# Patient Record
Sex: Female | Born: 1935 | Race: White | Hispanic: No | Smoking: Never smoker
Health system: Southern US, Community
[De-identification: ages and names within clinical notes are randomized; demographics above are authoritative.]

## PROBLEM LIST (undated history)

## (undated) DIAGNOSIS — E039 Hypothyroidism, unspecified: Secondary | ICD-10-CM

## (undated) DIAGNOSIS — I10 Essential (primary) hypertension: Secondary | ICD-10-CM

---

## 1988-10-28 HISTORY — PX: SHOULDER SURGERY: SHX246

## 1990-10-28 HISTORY — PX: SHOULDER SURGERY: SHX246

## 2015-12-26 DIAGNOSIS — E7800 Pure hypercholesterolemia, unspecified: Secondary | ICD-10-CM | POA: Diagnosis not present

## 2015-12-26 DIAGNOSIS — E039 Hypothyroidism, unspecified: Secondary | ICD-10-CM | POA: Diagnosis not present

## 2015-12-26 DIAGNOSIS — I10 Essential (primary) hypertension: Secondary | ICD-10-CM | POA: Diagnosis not present

## 2015-12-26 DIAGNOSIS — Z789 Other specified health status: Secondary | ICD-10-CM | POA: Diagnosis not present

## 2016-06-25 DIAGNOSIS — I10 Essential (primary) hypertension: Secondary | ICD-10-CM | POA: Diagnosis not present

## 2016-09-10 DIAGNOSIS — Z299 Encounter for prophylactic measures, unspecified: Secondary | ICD-10-CM | POA: Diagnosis not present

## 2016-09-10 DIAGNOSIS — Z7189 Other specified counseling: Secondary | ICD-10-CM | POA: Diagnosis not present

## 2016-09-10 DIAGNOSIS — Z1211 Encounter for screening for malignant neoplasm of colon: Secondary | ICD-10-CM | POA: Diagnosis not present

## 2016-09-10 DIAGNOSIS — R5383 Other fatigue: Secondary | ICD-10-CM | POA: Diagnosis not present

## 2016-09-10 DIAGNOSIS — Z79899 Other long term (current) drug therapy: Secondary | ICD-10-CM | POA: Diagnosis not present

## 2016-09-10 DIAGNOSIS — Z1389 Encounter for screening for other disorder: Secondary | ICD-10-CM | POA: Diagnosis not present

## 2016-09-10 DIAGNOSIS — E039 Hypothyroidism, unspecified: Secondary | ICD-10-CM | POA: Diagnosis not present

## 2016-09-10 DIAGNOSIS — Z0000 Encounter for general adult medical examination without abnormal findings: Secondary | ICD-10-CM | POA: Diagnosis not present

## 2016-09-10 DIAGNOSIS — E7800 Pure hypercholesterolemia, unspecified: Secondary | ICD-10-CM | POA: Diagnosis not present

## 2016-09-10 DIAGNOSIS — Z6826 Body mass index (BMI) 26.0-26.9, adult: Secondary | ICD-10-CM | POA: Diagnosis not present

## 2016-09-17 DIAGNOSIS — H35363 Drusen (degenerative) of macula, bilateral: Secondary | ICD-10-CM | POA: Diagnosis not present

## 2016-10-24 DIAGNOSIS — I10 Essential (primary) hypertension: Secondary | ICD-10-CM | POA: Diagnosis not present

## 2016-10-24 DIAGNOSIS — T50905A Adverse effect of unspecified drugs, medicaments and biological substances, initial encounter: Secondary | ICD-10-CM | POA: Diagnosis not present

## 2016-10-24 DIAGNOSIS — J019 Acute sinusitis, unspecified: Secondary | ICD-10-CM | POA: Diagnosis not present

## 2016-10-24 DIAGNOSIS — Z299 Encounter for prophylactic measures, unspecified: Secondary | ICD-10-CM | POA: Diagnosis not present

## 2016-10-24 DIAGNOSIS — T783XXA Angioneurotic edema, initial encounter: Secondary | ICD-10-CM | POA: Diagnosis not present

## 2017-01-14 DIAGNOSIS — I10 Essential (primary) hypertension: Secondary | ICD-10-CM | POA: Diagnosis not present

## 2017-01-14 DIAGNOSIS — Z299 Encounter for prophylactic measures, unspecified: Secondary | ICD-10-CM | POA: Diagnosis not present

## 2017-01-14 DIAGNOSIS — Z6826 Body mass index (BMI) 26.0-26.9, adult: Secondary | ICD-10-CM | POA: Diagnosis not present

## 2017-01-14 DIAGNOSIS — E039 Hypothyroidism, unspecified: Secondary | ICD-10-CM | POA: Diagnosis not present

## 2017-01-14 DIAGNOSIS — Z713 Dietary counseling and surveillance: Secondary | ICD-10-CM | POA: Diagnosis not present

## 2017-04-16 DIAGNOSIS — Z299 Encounter for prophylactic measures, unspecified: Secondary | ICD-10-CM | POA: Diagnosis not present

## 2017-04-16 DIAGNOSIS — E039 Hypothyroidism, unspecified: Secondary | ICD-10-CM | POA: Diagnosis not present

## 2017-04-16 DIAGNOSIS — Z6826 Body mass index (BMI) 26.0-26.9, adult: Secondary | ICD-10-CM | POA: Diagnosis not present

## 2017-04-16 DIAGNOSIS — I10 Essential (primary) hypertension: Secondary | ICD-10-CM | POA: Diagnosis not present

## 2017-04-16 DIAGNOSIS — Z713 Dietary counseling and surveillance: Secondary | ICD-10-CM | POA: Diagnosis not present

## 2017-09-16 DIAGNOSIS — Z79899 Other long term (current) drug therapy: Secondary | ICD-10-CM | POA: Diagnosis not present

## 2017-09-16 DIAGNOSIS — E7800 Pure hypercholesterolemia, unspecified: Secondary | ICD-10-CM | POA: Diagnosis not present

## 2017-09-16 DIAGNOSIS — Z6827 Body mass index (BMI) 27.0-27.9, adult: Secondary | ICD-10-CM | POA: Diagnosis not present

## 2017-09-16 DIAGNOSIS — Z7189 Other specified counseling: Secondary | ICD-10-CM | POA: Diagnosis not present

## 2017-09-16 DIAGNOSIS — E039 Hypothyroidism, unspecified: Secondary | ICD-10-CM | POA: Diagnosis not present

## 2017-09-16 DIAGNOSIS — Z1211 Encounter for screening for malignant neoplasm of colon: Secondary | ICD-10-CM | POA: Diagnosis not present

## 2017-09-16 DIAGNOSIS — Z1339 Encounter for screening examination for other mental health and behavioral disorders: Secondary | ICD-10-CM | POA: Diagnosis not present

## 2017-09-16 DIAGNOSIS — Z1331 Encounter for screening for depression: Secondary | ICD-10-CM | POA: Diagnosis not present

## 2017-09-16 DIAGNOSIS — Z299 Encounter for prophylactic measures, unspecified: Secondary | ICD-10-CM | POA: Diagnosis not present

## 2017-09-16 DIAGNOSIS — R5383 Other fatigue: Secondary | ICD-10-CM | POA: Diagnosis not present

## 2017-09-16 DIAGNOSIS — Z0000 Encounter for general adult medical examination without abnormal findings: Secondary | ICD-10-CM | POA: Diagnosis not present

## 2018-02-17 DIAGNOSIS — Z713 Dietary counseling and surveillance: Secondary | ICD-10-CM | POA: Diagnosis not present

## 2018-02-17 DIAGNOSIS — Z299 Encounter for prophylactic measures, unspecified: Secondary | ICD-10-CM | POA: Diagnosis not present

## 2018-02-17 DIAGNOSIS — E039 Hypothyroidism, unspecified: Secondary | ICD-10-CM | POA: Diagnosis not present

## 2018-02-17 DIAGNOSIS — I10 Essential (primary) hypertension: Secondary | ICD-10-CM | POA: Diagnosis not present

## 2018-07-21 DIAGNOSIS — H43812 Vitreous degeneration, left eye: Secondary | ICD-10-CM | POA: Diagnosis not present

## 2018-09-22 DIAGNOSIS — I10 Essential (primary) hypertension: Secondary | ICD-10-CM | POA: Diagnosis not present

## 2018-09-22 DIAGNOSIS — R5383 Other fatigue: Secondary | ICD-10-CM | POA: Diagnosis not present

## 2018-09-22 DIAGNOSIS — Z79899 Other long term (current) drug therapy: Secondary | ICD-10-CM | POA: Diagnosis not present

## 2018-09-22 DIAGNOSIS — Z6826 Body mass index (BMI) 26.0-26.9, adult: Secondary | ICD-10-CM | POA: Diagnosis not present

## 2018-09-22 DIAGNOSIS — Z0000 Encounter for general adult medical examination without abnormal findings: Secondary | ICD-10-CM | POA: Diagnosis not present

## 2018-09-22 DIAGNOSIS — E7800 Pure hypercholesterolemia, unspecified: Secondary | ICD-10-CM | POA: Diagnosis not present

## 2018-09-22 DIAGNOSIS — Z1331 Encounter for screening for depression: Secondary | ICD-10-CM | POA: Diagnosis not present

## 2018-09-22 DIAGNOSIS — Z1339 Encounter for screening examination for other mental health and behavioral disorders: Secondary | ICD-10-CM | POA: Diagnosis not present

## 2018-09-22 DIAGNOSIS — Z1211 Encounter for screening for malignant neoplasm of colon: Secondary | ICD-10-CM | POA: Diagnosis not present

## 2018-09-22 DIAGNOSIS — Z7189 Other specified counseling: Secondary | ICD-10-CM | POA: Diagnosis not present

## 2018-09-22 DIAGNOSIS — Z299 Encounter for prophylactic measures, unspecified: Secondary | ICD-10-CM | POA: Diagnosis not present

## 2018-09-22 DIAGNOSIS — E039 Hypothyroidism, unspecified: Secondary | ICD-10-CM | POA: Diagnosis not present

## 2018-10-28 DIAGNOSIS — J029 Acute pharyngitis, unspecified: Secondary | ICD-10-CM | POA: Diagnosis not present

## 2018-10-28 DIAGNOSIS — J019 Acute sinusitis, unspecified: Secondary | ICD-10-CM | POA: Diagnosis not present

## 2018-10-28 DIAGNOSIS — R05 Cough: Secondary | ICD-10-CM | POA: Diagnosis not present

## 2018-10-28 DIAGNOSIS — R51 Headache: Secondary | ICD-10-CM | POA: Diagnosis not present

## 2019-01-19 ENCOUNTER — Inpatient Hospital Stay (HOSPITAL_COMMUNITY)
Admission: AD | Admit: 2019-01-19 | Discharge: 2019-01-23 | DRG: 357 | Disposition: A | Discharge status: Home / Self Care | Payer: Medicare Other | Source: Other Acute Inpatient Hospital | Attending: General Surgery | Admitting: General Surgery

## 2019-01-19 ENCOUNTER — Encounter (HOSPITAL_COMMUNITY): Payer: Self-pay | Admitting: *Deleted

## 2019-01-19 ENCOUNTER — Observation Stay (HOSPITAL_COMMUNITY): Payer: Medicare Other

## 2019-01-19 ENCOUNTER — Other Ambulatory Visit: Payer: Self-pay

## 2019-01-19 DIAGNOSIS — K922 Gastrointestinal hemorrhage, unspecified: Secondary | ICD-10-CM | POA: Diagnosis not present

## 2019-01-19 DIAGNOSIS — Z888 Allergy status to other drugs, medicaments and biological substances status: Secondary | ICD-10-CM

## 2019-01-19 DIAGNOSIS — E876 Hypokalemia: Secondary | ICD-10-CM | POA: Diagnosis not present

## 2019-01-19 DIAGNOSIS — Z79899 Other long term (current) drug therapy: Secondary | ICD-10-CM | POA: Diagnosis not present

## 2019-01-19 DIAGNOSIS — K55039 Acute (reversible) ischemia of large intestine, extent unspecified: Secondary | ICD-10-CM | POA: Diagnosis not present

## 2019-01-19 DIAGNOSIS — I10 Essential (primary) hypertension: Secondary | ICD-10-CM | POA: Diagnosis present

## 2019-01-19 DIAGNOSIS — J9811 Atelectasis: Secondary | ICD-10-CM | POA: Diagnosis not present

## 2019-01-19 DIAGNOSIS — Z79890 Hormone replacement therapy (postmenopausal): Secondary | ICD-10-CM | POA: Diagnosis not present

## 2019-01-19 DIAGNOSIS — Z8719 Personal history of other diseases of the digestive system: Secondary | ICD-10-CM | POA: Diagnosis present

## 2019-01-19 DIAGNOSIS — K648 Other hemorrhoids: Secondary | ICD-10-CM | POA: Diagnosis present

## 2019-01-19 DIAGNOSIS — D72829 Elevated white blood cell count, unspecified: Secondary | ICD-10-CM | POA: Diagnosis present

## 2019-01-19 DIAGNOSIS — E039 Hypothyroidism, unspecified: Secondary | ICD-10-CM | POA: Diagnosis present

## 2019-01-19 DIAGNOSIS — K644 Residual hemorrhoidal skin tags: Secondary | ICD-10-CM | POA: Diagnosis present

## 2019-01-19 DIAGNOSIS — K529 Noninfective gastroenteritis and colitis, unspecified: Secondary | ICD-10-CM

## 2019-01-19 DIAGNOSIS — K869 Disease of pancreas, unspecified: Secondary | ICD-10-CM

## 2019-01-19 DIAGNOSIS — K633 Ulcer of intestine: Secondary | ICD-10-CM | POA: Diagnosis present

## 2019-01-19 DIAGNOSIS — K625 Hemorrhage of anus and rectum: Secondary | ICD-10-CM

## 2019-01-19 HISTORY — DX: Hypothyroidism, unspecified: E03.9

## 2019-01-19 HISTORY — DX: Essential (primary) hypertension: I10

## 2019-01-19 LAB — CBC
HCT: 44.4 % (ref 36.0–46.0)
Hemoglobin: 15.2 g/dL — ABNORMAL HIGH (ref 12.0–15.0)
MCH: 30.6 pg (ref 26.0–34.0)
MCHC: 34.2 g/dL (ref 30.0–36.0)
MCV: 89.5 fL (ref 80.0–100.0)
Platelets: 196 10*3/uL (ref 150–400)
RBC: 4.96 MIL/uL (ref 3.87–5.11)
RDW: 13.6 % (ref 11.5–15.5)
WBC: 16.8 10*3/uL — ABNORMAL HIGH (ref 4.0–10.5)
nRBC: 0 % (ref 0.0–0.2)

## 2019-01-19 LAB — BASIC METABOLIC PANEL
Anion gap: 12 (ref 5–15)
BUN: 17 mg/dL (ref 8–23)
CHLORIDE: 101 mmol/L (ref 98–111)
CO2: 24 mmol/L (ref 22–32)
Calcium: 9.5 mg/dL (ref 8.9–10.3)
Creatinine, Ser: 0.68 mg/dL (ref 0.44–1.00)
GFR calc Af Amer: >60 mL/min (ref >60)
GFR calc non Af Amer: >60 mL/min (ref >60)
Glucose, Bld: 153 mg/dL — ABNORMAL HIGH (ref 70–99)
Potassium: 4.6 mmol/L (ref 3.5–5.1)
Sodium: 137 mmol/L (ref 135–145)

## 2019-01-19 LAB — TYPE AND SCREEN
ABO/RH(D): A POS
Antibody Screen: NEGATIVE

## 2019-01-19 MED ORDER — ACETAMINOPHEN 325 MG PO TABS: 650.0000 mg | ORAL_TABLET | Freq: Four times a day (QID) | ORAL | Status: DC | PRN

## 2019-01-19 MED ORDER — PEG 3350-KCL-NA BICARB-NACL 420 G PO SOLR
2000.0000 mL | Freq: Once | ORAL | Status: AC
  Administered 2019-01-20: 2000 mL via ORAL

## 2019-01-19 MED ORDER — SODIUM CHLORIDE 0.9 % IV SOLN: 250.0000 mL | INTRAVENOUS | Status: DC | PRN

## 2019-01-19 MED ORDER — SODIUM CHLORIDE 0.9% FLUSH: 3.0000 mL | INTRAVENOUS | Status: DC | PRN

## 2019-01-19 MED ORDER — TRAZODONE HCL 50 MG PO TABS: 25.0000 mg | ORAL_TABLET | Freq: Every evening | ORAL | Status: DC | PRN

## 2019-01-19 MED ORDER — LEVOTHYROXINE SODIUM 75 MCG PO TABS
75.0000 ug | ORAL_TABLET | Freq: Every day | ORAL | Status: DC
  Administered 2019-01-20 – 2019-01-23 (×4): 75 ug via ORAL
  Filled 2019-01-19 (×4): qty 1

## 2019-01-19 MED ORDER — ONDANSETRON HCL 4 MG/2ML IJ SOLN
4.0000 mg | Freq: Four times a day (QID) | INTRAMUSCULAR | Status: DC | PRN
  Administered 2019-01-19: 4 mg via INTRAVENOUS
  Filled 2019-01-19 (×2): qty 2

## 2019-01-19 MED ORDER — METOPROLOL SUCCINATE ER 50 MG PO TB24
75.0000 mg | ORAL_TABLET | Freq: Two times a day (BID) | ORAL | Status: DC
  Administered 2019-01-19 – 2019-01-23 (×9): 75 mg via ORAL
  Filled 2019-01-19 (×9): qty 1

## 2019-01-19 MED ORDER — ONDANSETRON HCL 4 MG PO TABS
4.0000 mg | ORAL_TABLET | Freq: Four times a day (QID) | ORAL | Status: DC | PRN
  Administered 2019-01-19: 4 mg via ORAL
  Filled 2019-01-19: qty 1

## 2019-01-19 MED ORDER — SODIUM CHLORIDE 0.9% FLUSH
3.0000 mL | Freq: Two times a day (BID) | INTRAVENOUS | Status: DC
  Administered 2019-01-19 – 2019-01-23 (×5): 3 mL via INTRAVENOUS

## 2019-01-19 MED ORDER — PANTOPRAZOLE SODIUM 40 MG PO TBEC
40.0000 mg | DELAYED_RELEASE_TABLET | Freq: Every day | ORAL | Status: DC
  Administered 2019-01-19 – 2019-01-23 (×5): 40 mg via ORAL
  Filled 2019-01-19 (×5): qty 1

## 2019-01-19 MED ORDER — ACETAMINOPHEN 650 MG RE SUPP: 650.0000 mg | Freq: Four times a day (QID) | RECTAL | Status: DC | PRN

## 2019-01-19 MED ORDER — PEG 3350-KCL-NA BICARB-NACL 420 G PO SOLR
2000.0000 mL | Freq: Once | ORAL | Status: AC
  Administered 2019-01-19: 2000 mL via ORAL

## 2019-01-19 NOTE — H&P (View-Only) (Signed)
Referring Provider: Dr. Laural Benes Primary Care Physician:  Ignatius Specking, MD Primary Gastroenterologist:  Dr. Darrick Penna   Date of Admission: 01/19/2019 Date of Consultation: 01/19/2019  Reason for Consultation:  Rectal bleeding  HPI:  Susan Mercado is an 83 y.o. year old female who presented to [facility] ED early this morning with new onset rectal bleeding. She was transferred here as no GI coverage available in [place]. Per H&P, Hgb was 14 at outside facility. Repeat Hgb 15.2 today.    Woke up and had urge to have a BM around 3 am and subsequently had large amount of bright red blood per rectum. She had several more episodes but notes bleeding has been tapering.  Dark pieces of blood just recently "50% less than before". Water is lighter in color. No abdominal pain. Stomach feels "blah". No prior history of GI bleeding. No prior colonoscopy. No issues with constipation or straining. No rectal discomfort, itching, burning. No NSAIDs. No family history of colorectal cancer or polyps. BM usually daily. Sometimes hard, sometimes soft. Comes out in small pieces at a time. No bowel habit changes.   Lives at home alone. Active. Exercises.   Past Medical History:  Diagnosis Date  . Hypertension   . Hypothyroidism     Past Surgical History:  Procedure Laterality Date  . SHOULDER SURGERY  1990   bil shoulder spurs removed  . SHOULDER SURGERY  1992   spurs    Prior to Admission medications   Medication Sig Start Date End Date Taking? Authorizing Provider  amLODipine (NORVASC) 5 MG tablet Take 5 mg by mouth daily. 10/25/18  Yes [provider]  levothyroxine (SYNTHROID, LEVOTHROID) 75 MCG tablet Take 75 mcg by mouth daily. 11/02/18  Yes [provider]  metoprolol succinate (TOPROL-XL) 50 MG 24 hr tablet Take 1.5 tablets by mouth 2 (two) times daily. 11/02/18  Yes [provider]    Current Facility-Administered Medications  Medication Dose Route Frequency Provider  Last Rate Last Dose  . 0.9 %  sodium chloride infusion  250 mL Intravenous PRN Johnson, Clanford L, MD      . acetaminophen (TYLENOL) tablet 650 mg  650 mg Oral Q6H PRN Johnson, Clanford L, MD       Or  . acetaminophen (TYLENOL) suppository 650 mg  650 mg Rectal Q6H PRN Laural Benes, Clanford L, MD      . Melene Muller ON 01/20/2019] levothyroxine (SYNTHROID, LEVOTHROID) tablet 75 mcg  75 mcg Oral QAC breakfast Johnson, Clanford L, MD      . metoprolol succinate (TOPROL-XL) 24 hr tablet 75 mg  75 mg Oral BID Johnson, Clanford L, MD   75 mg at 01/19/19 1132  . ondansetron (ZOFRAN) tablet 4 mg  4 mg Oral Q6H PRN Johnson, Clanford L, MD   4 mg at 01/19/19 1137   Or  . ondansetron (ZOFRAN) injection 4 mg  4 mg Intravenous Q6H PRN Johnson, Clanford L, MD      . pantoprazole (PROTONIX) EC tablet 40 mg  40 mg Oral Daily Johnson, Clanford L, MD   40 mg at 01/19/19 1133  . sodium chloride flush (NS) 0.9 % injection 3 mL  3 mL Intravenous Q12H Johnson, Clanford L, MD      . sodium chloride flush (NS) 0.9 % injection 3 mL  3 mL Intravenous PRN Johnson, Clanford L, MD      . traZODone (DESYREL) tablet 25 mg  25 mg Oral QHS PRN Cleora Fleet, MD  Allergies as of 01/19/2019 - Review Complete 01/19/2019  Allergen Reaction Noted  . Lisinopril Swelling 01/19/2019  . Shellfish allergy Nausea And Vomiting 01/19/2019    Family History  Problem Relation Age of Onset  . Colon cancer Neg Hx   . Colon polyps Neg Hx     Social History   Socioeconomic History  . Marital status: Divorced    Spouse name: Not on file  . Number of children: Not on file  . Years of education: Not on file  . Highest education level: Not on file  Occupational History  . Occupation: retired    Comment: clerical in a factory, Engineer, materials at [employer]  . Financial resource strain: Not hard at all  . Food insecurity:    Worry: Never true    Inability: Never true  . Transportation needs:    Medical: No    Non-medical: No   Tobacco Use  . Smoking status: Never Smoker  . Smokeless tobacco: Never Used  Substance and Sexual Activity  . Alcohol use: Not Currently  . Drug use: Not Currently  . Sexual activity: Not Currently  Lifestyle  . Physical activity:    Days per week: 7 days    Minutes per session: 20 min  . Stress: Not at all  Relationships  . Social connections:    Talks on phone: Patient refused    Gets together: Patient refused    Attends religious service: Patient refused    Active member of club or organization: Patient refused    Attends meetings of clubs or organizations: Patient refused    Relationship status: Patient refused  . Intimate partner violence:    Fear of current or ex partner: Patient refused    Emotionally abused: Patient refused    Physically abused: Patient refused    Forced sexual activity: Patient refused  Other Topics Concern  . Not on file  Social History Narrative  . Not on file    Review of Systems: Gen: Denies fever, chills, loss of appetite, change in weight or weight loss CV: Denies chest pain, heart palpitations, syncope, edema  Resp: Denies shortness of breath with rest, cough, wheezing GI: see HPI GU : Denies urinary burning, urinary frequency, urinary incontinence.  MS: Denies joint pain,swelling, cramping Derm: Denies rash, itching, dry skin Psych: Denies depression, anxiety,confusion, or memory loss Heme: see HPI  Physical Exam: Vital signs in last 24 hours: Temp:  [97.7 F (36.5 C)] 97.7 F (36.5 C) (03/24 0952) Pulse Rate:  [87] 87 (03/24 0952) Resp:  [18] 18 (03/24 0952) BP: (140)/(70) 140/70 (03/24 0952) SpO2:  [98 %] 98 % (03/24 0952) Weight:  [69.1 kg] 69.1 kg (03/24 0952) Last BM Date: 01/19/19 General:   Alert,  Well-developed, well-nourished, pleasant and cooperative in NAD Head:  Normocephalic and atraumatic. Eyes:  Sclera clear, no icterus.   Conjunctiva pink. Ears:  Normal auditory acuity. Nose:  No deformity, discharge,  or  lesions. Mouth:  No deformity or lesions, dentition normal. Lungs:  Clear throughout to auscultation.   No wheezes, crackles, or rhonchi. No acute distress. Heart:  Regular rate and rhythm; no murmurs, clicks, rubs,  or gallops. Abdomen:  Soft, nontender and nondistended. No masses, hepatosplenomegaly or hernias noted. Normal bowel sounds, without guarding, and without rebound.   Rectal:  Deferred until time of colonoscopy.   Msk:  Symmetrical without gross deformities. Normal posture. Extremities:  Without  edema. Neurologic:  Alert and  oriented x4 Psych:  Alert and  cooperative. Normal mood and affect.  Intake/Output from previous day: No intake/output data recorded. Intake/Output this shift: No intake/output data recorded.  Lab Results: Recent Labs    01/19/19 1118  WBC 16.8*  HGB 15.2*  HCT 44.4  PLT 196   BMET Recent Labs    01/19/19 1118  NA 137  K 4.6  CL 101  CO2 24  GLUCOSE 153*  BUN 17  CREATININE 0.68  CALCIUM 9.5      Impression/Plan: 82-year-old female with new onset painless hematochezia, hemodynamically stable, without acute blood loss anemia noted. Clinically improving since onset of rectal bleeding with reported tapering of bleeding. No prior colonoscopy, and she denies any NSAIDs. No family history of colorectal cancer or polyps. Differentials including benign anorectal source, self-limiting diverticular bleed, less likely occult malignancy or polyps. Does not seem consistent with ischemic colitis. She desires endoscopic evaluation while inpatient.   Remain on clear liquids and will discuss with Dr. Fields diagnostic colonoscopy on 3/25. Risks and benefits discussed with patient in detail, and she desires to proceed. NPO after midnight except sips with meds. Further recommendations to follow.  Dezaree Tracey W. Chelsee Hosie, PhD, ANP-BC [facility]       LOS: 1 day    01/19/2019, 1:24 PM    

## 2019-01-19 NOTE — H&P (Signed)
History and Physical  Breland Kilcrease HUT:[id number] DOB: [birth year] DOA: 01/19/2019  Referring physician: Alona Bene MD  PCP: Ignatius Specking, MD   Chief Complaint: rectal bleeding  HPI: Susan Mercado is a 83 y.o. female in fairly good health with history of hypothyroidism and hypertension and not taking anticoagulants.  She lives alone but has family that lives close and checks on her regularly.  She reports that she was feeling well yesterday and had nothing to eat or drink out of the ordinary.  She says that she woke up in the middle of the night with an overwhelming urge to defecate.  She noticed a large amount of blood in the toilet that she describes as reddish to pink tinged water. She has had several small bloody stools since that time.  She denies having any pain with defecation.  She denies abdominal pain.  She denies diarrhea and fever.  She denies constipation or straining.  She denies cough.  She denies any recent sick contacts.  She has no prior history of GI bleeding.  She only follows her PCP.  She has never seen GI and does not recall any prior GI screening with EGD or colonoscopy.  She denies taking NSAIDS and aspirin.  She was seen at [facility] ER.  She has been hemodynamically stable.  Her Hg was 14.  She was hemoccult positive.  Her WBC was 16.3, Platelets 161, glucose 187, Creatinine 0.68, sodium 136, Anion Gap 26, CO2 19, lipase 24, AST 22, ALT 10, potassium 4.4.      The ED provider spoke with Dr. Sanda Klein who accepted patient for transfer to [facility] so that she can have GI consultation.    Review of Systems: All systems reviewed and apart from history of presenting illness, are negative.  Past Medical History:  Diagnosis Date  . Hypertension   . Hypothyroidism    Past Surgical History:  Procedure Laterality Date  . SHOULDER SURGERY  1990   bil shoulder spurs removed   Social History:  reports that she has never smoked. She has never used smokeless tobacco. She  reports previous alcohol use. She reports previous drug use.  Allergies  Allergen Reactions  . Lisinopril Swelling  . Shellfish Allergy Nausea And Vomiting    History reviewed. No pertinent family history.  Prior to Admission medications   Not on File   Physical Exam: Vitals:   01/19/19 0952  BP: 140/70  Pulse: 87  Resp: 18  Temp: 97.7 F (36.5 C)  TempSrc: Oral  SpO2: 98%  Weight: 69.1 kg  Height: 5' 5.5" (1.664 m)     General exam: Moderately built and nourished patient, lying comfortably supine on the gurney in no obvious distress.  Head, eyes and ENT: Nontraumatic and normocephalic. Pupils equally reacting to light and accommodation. Oral mucosa dry.  Neck: Supple. No JVD, carotid bruit or thyromegaly.  Lymphatics: No lymphadenopathy.  Respiratory system: Clear to auscultation. No increased work of breathing.  Cardiovascular system: S1 and S2 heard, RRR. No JVD, murmurs, gallops, clicks or pedal edema.  Gastrointestinal system: Abdomen is nondistended, soft and nontender. Normal bowel sounds heard. No organomegaly or masses appreciated.  Central nervous system: Alert and oriented. No focal neurological deficits.  Extremities: Symmetric 5 x 5 power. Peripheral pulses symmetrically felt.   Skin: No rashes or acute findings.  Musculoskeletal system: Negative exam.  Psychiatry: Pleasant and cooperative.  Labs on Admission:  Basic Metabolic Panel: No results for input(s): NA, K, CL, CO2, GLUCOSE,  BUN, CREATININE, CALCIUM, MG, PHOS in the last 168 hours. Liver Function Tests: No results for input(s): AST, ALT, ALKPHOS, BILITOT, PROT, ALBUMIN in the last 168 hours. No results for input(s): LIPASE, AMYLASE in the last 168 hours. No results for input(s): AMMONIA in the last 168 hours. CBC: No results for input(s): WBC, NEUTROABS, HGB, HCT, MCV, PLT in the last 168 hours. Cardiac Enzymes: No results for input(s): CKTOTAL, CKMB, CKMBINDEX, TROPONINI in the last  168 hours.  BNP (last 3 results) No results for input(s): PROBNP in the last 8760 hours. CBG: No results for input(s): GLUCAP in the last 168 hours.  Radiological Exams on Admission: No results found.  Assessment/Plan Principal Problem:   Painless rectal bleeding Active Problems:   Hypertension   Hypothyroidism  1. Painless rectal bleeding - differential includes diverticulosis, hemorrhoids, bleeding polyps and possible malignancy given patient has never had a colon cancer screening.  Pt is hemodynamically stable and bleeding seems to be slowing down.  I have requested a GI consultation.  Follow Hg.  Further management pending GI recommendations.  Pt may qualify for outpatient work up.   2. Hypertension - resume home metoprolol. BPs well controlled. Hold home amlodipine for now.   3. Hypothyrodism - resume home levothyroxine.    DVT Prophylaxis: SCDs Code Status: Full   Family Communication: patient   Disposition Plan: Observation    Time spent: 56 minutes   Jaaron Oleson Laural Benes, MD [facility] How to contact the [facility] Attending or Consulting provider 7A - 7P or covering provider during after hours 7P -7A, for this patient?  1. Check the care team in [facility] and look for a) attending/consulting [facility] provider listed and b) the [facility] team listed 2. Log into [email or web address] and use [facility] universal password to access. If you do not have the password, please contact the hospital operator. 3. Locate the [facility] provider you are looking for under [facility] and page to a number that you can be directly reached. 4. If you still have difficulty reaching the provider, please page the [facility] (Director on Call) for the Hospitalists listed on amion for assistance.

## 2019-01-19 NOTE — Consult Note (Signed)
Referring Provider: Dr. Laural Benes Primary Care Physician:  Ignatius Specking, MD Primary Gastroenterologist:  Dr. Darrick Penna   Date of Admission: 01/19/2019 Date of Consultation: 01/19/2019  Reason for Consultation:  Rectal bleeding  HPI:  Susan Mercado is an 83 y.o. year old female who presented to [facility] ED early this morning with new onset rectal bleeding. She was transferred here as no GI coverage available in [place]. Per H&P, Hgb was 14 at outside facility. Repeat Hgb 15.2 today.    Woke up and had urge to have a BM around 3 am and subsequently had large amount of bright red blood per rectum. She had several more episodes but notes bleeding has been tapering.  Dark pieces of blood just recently "50% less than before". Water is lighter in color. No abdominal pain. Stomach feels "blah". No prior history of GI bleeding. No prior colonoscopy. No issues with constipation or straining. No rectal discomfort, itching, burning. No NSAIDs. No family history of colorectal cancer or polyps. BM usually daily. Sometimes hard, sometimes soft. Comes out in small pieces at a time. No bowel habit changes.   Lives at home alone. Active. Exercises.   Past Medical History:  Diagnosis Date  . Hypertension   . Hypothyroidism     Past Surgical History:  Procedure Laterality Date  . SHOULDER SURGERY  1990   bil shoulder spurs removed  . SHOULDER SURGERY  1992   spurs    Prior to Admission medications   Medication Sig Start Date End Date Taking? Authorizing Provider  amLODipine (NORVASC) 5 MG tablet Take 5 mg by mouth daily. 10/25/18  Yes [provider]  levothyroxine (SYNTHROID, LEVOTHROID) 75 MCG tablet Take 75 mcg by mouth daily. 11/02/18  Yes [provider]  metoprolol succinate (TOPROL-XL) 50 MG 24 hr tablet Take 1.5 tablets by mouth 2 (two) times daily. 11/02/18  Yes [provider]    Current Facility-Administered Medications  Medication Dose Route Frequency Provider  Last Rate Last Dose  . 0.9 %  sodium chloride infusion  250 mL Intravenous PRN Johnson, Clanford L, MD      . acetaminophen (TYLENOL) tablet 650 mg  650 mg Oral Q6H PRN Johnson, Clanford L, MD       Or  . acetaminophen (TYLENOL) suppository 650 mg  650 mg Rectal Q6H PRN Laural Benes, Clanford L, MD      . Melene Muller ON 01/20/2019] levothyroxine (SYNTHROID, LEVOTHROID) tablet 75 mcg  75 mcg Oral QAC breakfast Johnson, Clanford L, MD      . metoprolol succinate (TOPROL-XL) 24 hr tablet 75 mg  75 mg Oral BID Johnson, Clanford L, MD   75 mg at 01/19/19 1132  . ondansetron (ZOFRAN) tablet 4 mg  4 mg Oral Q6H PRN Johnson, Clanford L, MD   4 mg at 01/19/19 1137   Or  . ondansetron (ZOFRAN) injection 4 mg  4 mg Intravenous Q6H PRN Johnson, Clanford L, MD      . pantoprazole (PROTONIX) EC tablet 40 mg  40 mg Oral Daily Johnson, Clanford L, MD   40 mg at 01/19/19 1133  . sodium chloride flush (NS) 0.9 % injection 3 mL  3 mL Intravenous Q12H Johnson, Clanford L, MD      . sodium chloride flush (NS) 0.9 % injection 3 mL  3 mL Intravenous PRN Johnson, Clanford L, MD      . traZODone (DESYREL) tablet 25 mg  25 mg Oral QHS PRN Cleora Fleet, MD  Allergies as of 01/19/2019 - Review Complete 01/19/2019  Allergen Reaction Noted  . Lisinopril Swelling 01/19/2019  . Shellfish allergy Nausea And Vomiting 01/19/2019    Family History  Problem Relation Age of Onset  . Colon cancer Neg Hx   . Colon polyps Neg Hx     Social History   Socioeconomic History  . Marital status: Divorced    Spouse name: Not on file  . Number of children: Not on file  . Years of education: Not on file  . Highest education level: Not on file  Occupational History  . Occupation: retired    Comment: clerical in a factory, Engineer, materials at [employer]  . Financial resource strain: Not hard at all  . Food insecurity:    Worry: Never true    Inability: Never true  . Transportation needs:    Medical: No    Non-medical: No   Tobacco Use  . Smoking status: Never Smoker  . Smokeless tobacco: Never Used  Substance and Sexual Activity  . Alcohol use: Not Currently  . Drug use: Not Currently  . Sexual activity: Not Currently  Lifestyle  . Physical activity:    Days per week: 7 days    Minutes per session: 20 min  . Stress: Not at all  Relationships  . Social connections:    Talks on phone: Patient refused    Gets together: Patient refused    Attends religious service: Patient refused    Active member of club or organization: Patient refused    Attends meetings of clubs or organizations: Patient refused    Relationship status: Patient refused  . Intimate partner violence:    Fear of current or ex partner: Patient refused    Emotionally abused: Patient refused    Physically abused: Patient refused    Forced sexual activity: Patient refused  Other Topics Concern  . Not on file  Social History Narrative  . Not on file    Review of Systems: Gen: Denies fever, chills, loss of appetite, change in weight or weight loss CV: Denies chest pain, heart palpitations, syncope, edema  Resp: Denies shortness of breath with rest, cough, wheezing GI: see HPI GU : Denies urinary burning, urinary frequency, urinary incontinence.  MS: Denies joint pain,swelling, cramping Derm: Denies rash, itching, dry skin Psych: Denies depression, anxiety,confusion, or memory loss Heme: see HPI  Physical Exam: Vital signs in last 24 hours: Temp:  [97.7 F (36.5 C)] 97.7 F (36.5 C) (03/24 0952) Pulse Rate:  [87] 87 (03/24 0952) Resp:  [18] 18 (03/24 0952) BP: (140)/(70) 140/70 (03/24 0952) SpO2:  [98 %] 98 % (03/24 0952) Weight:  [69.1 kg] 69.1 kg (03/24 0952) Last BM Date: 01/19/19 General:   Alert,  Well-developed, well-nourished, pleasant and cooperative in NAD Head:  Normocephalic and atraumatic. Eyes:  Sclera clear, no icterus.   Conjunctiva pink. Ears:  Normal auditory acuity. Nose:  No deformity, discharge,  or  lesions. Mouth:  No deformity or lesions, dentition normal. Lungs:  Clear throughout to auscultation.   No wheezes, crackles, or rhonchi. No acute distress. Heart:  Regular rate and rhythm; no murmurs, clicks, rubs,  or gallops. Abdomen:  Soft, nontender and nondistended. No masses, hepatosplenomegaly or hernias noted. Normal bowel sounds, without guarding, and without rebound.   Rectal:  Deferred until time of colonoscopy.   Msk:  Symmetrical without gross deformities. Normal posture. Extremities:  Without  edema. Neurologic:  Alert and  oriented x4 Psych:  Alert and  cooperative. Normal mood and affect.  Intake/Output from previous day: No intake/output data recorded. Intake/Output this shift: No intake/output data recorded.  Lab Results: Recent Labs    01/19/19 1118  WBC 16.8*  HGB 15.2*  HCT 44.4  PLT 196   BMET Recent Labs    01/19/19 1118  NA 137  K 4.6  CL 101  CO2 24  GLUCOSE 153*  BUN 17  CREATININE 0.68  CALCIUM 9.5      Impression/Plan: 83 year old female with new onset painless hematochezia, hemodynamically stable, without acute blood loss anemia noted. Clinically improving since onset of rectal bleeding with reported tapering of bleeding. No prior colonoscopy, and she denies any NSAIDs. No family history of colorectal cancer or polyps. Differentials including benign anorectal source, self-limiting diverticular bleed, less likely occult malignancy or polyps. Does not seem consistent with ischemic colitis. She desires endoscopic evaluation while inpatient.   Remain on clear liquids and will discuss with Dr. Darrick Penna diagnostic colonoscopy on 3/25. Risks and benefits discussed with patient in detail, and she desires to proceed. NPO after midnight except sips with meds. Further recommendations to follow.  Susan Mink, PhD, ANP-BC [facility]       LOS: 1 day    01/19/2019, 1:24 PM

## 2019-01-20 ENCOUNTER — Observation Stay (HOSPITAL_COMMUNITY): Payer: Medicare Other | Admitting: Anesthesiology

## 2019-01-20 ENCOUNTER — Encounter (HOSPITAL_COMMUNITY)
Admission: AD | Disposition: A | Discharge status: Home / Self Care | Payer: Self-pay | Source: Other Acute Inpatient Hospital | Attending: General Surgery

## 2019-01-20 ENCOUNTER — Encounter (HOSPITAL_COMMUNITY): Payer: Self-pay | Admitting: Gastroenterology

## 2019-01-20 ENCOUNTER — Observation Stay (HOSPITAL_COMMUNITY): Payer: Medicare Other

## 2019-01-20 DIAGNOSIS — K55049 Acute infarction of large intestine, extent unspecified: Secondary | ICD-10-CM | POA: Diagnosis not present

## 2019-01-20 DIAGNOSIS — K633 Ulcer of intestine: Secondary | ICD-10-CM

## 2019-01-20 DIAGNOSIS — K529 Noninfective gastroenteritis and colitis, unspecified: Secondary | ICD-10-CM | POA: Diagnosis not present

## 2019-01-20 DIAGNOSIS — Z79890 Hormone replacement therapy (postmenopausal): Secondary | ICD-10-CM | POA: Diagnosis not present

## 2019-01-20 DIAGNOSIS — D72829 Elevated white blood cell count, unspecified: Secondary | ICD-10-CM

## 2019-01-20 DIAGNOSIS — E039 Hypothyroidism, unspecified: Secondary | ICD-10-CM | POA: Diagnosis present

## 2019-01-20 DIAGNOSIS — K625 Hemorrhage of anus and rectum: Secondary | ICD-10-CM | POA: Diagnosis not present

## 2019-01-20 DIAGNOSIS — Z888 Allergy status to other drugs, medicaments and biological substances status: Secondary | ICD-10-CM | POA: Diagnosis not present

## 2019-01-20 DIAGNOSIS — I361 Nonrheumatic tricuspid (valve) insufficiency: Secondary | ICD-10-CM | POA: Diagnosis not present

## 2019-01-20 DIAGNOSIS — K648 Other hemorrhoids: Secondary | ICD-10-CM

## 2019-01-20 DIAGNOSIS — K55039 Acute (reversible) ischemia of large intestine, extent unspecified: Secondary | ICD-10-CM

## 2019-01-20 DIAGNOSIS — J9811 Atelectasis: Secondary | ICD-10-CM | POA: Diagnosis present

## 2019-01-20 DIAGNOSIS — K869 Disease of pancreas, unspecified: Secondary | ICD-10-CM | POA: Diagnosis not present

## 2019-01-20 DIAGNOSIS — Z79899 Other long term (current) drug therapy: Secondary | ICD-10-CM | POA: Diagnosis not present

## 2019-01-20 DIAGNOSIS — R188 Other ascites: Secondary | ICD-10-CM | POA: Diagnosis not present

## 2019-01-20 DIAGNOSIS — Z8719 Personal history of other diseases of the digestive system: Secondary | ICD-10-CM | POA: Diagnosis present

## 2019-01-20 DIAGNOSIS — K644 Residual hemorrhoidal skin tags: Secondary | ICD-10-CM | POA: Diagnosis present

## 2019-01-20 DIAGNOSIS — I10 Essential (primary) hypertension: Secondary | ICD-10-CM | POA: Diagnosis present

## 2019-01-20 DIAGNOSIS — E876 Hypokalemia: Secondary | ICD-10-CM | POA: Diagnosis not present

## 2019-01-20 HISTORY — PX: BIOPSY: SHX5522

## 2019-01-20 HISTORY — PX: SIGMOIDOSCOPY: SHX6686

## 2019-01-20 HISTORY — PX: COLECTOMY WITH COLOSTOMY CREATION/HARTMANN PROCEDURE: SHX6598

## 2019-01-20 LAB — CBC
HCT: 43.2 % (ref 36.0–46.0)
Hemoglobin: 14.5 g/dL (ref 12.0–15.0)
MCH: 30.2 pg (ref 26.0–34.0)
MCHC: 33.6 g/dL (ref 30.0–36.0)
MCV: 90 fL (ref 80.0–100.0)
NRBC: 0 % (ref 0.0–0.2)
Platelets: 195 10*3/uL (ref 150–400)
RBC: 4.8 MIL/uL (ref 3.87–5.11)
RDW: 13.8 % (ref 11.5–15.5)
WBC: 21.7 10*3/uL — ABNORMAL HIGH (ref 4.0–10.5)

## 2019-01-20 LAB — BASIC METABOLIC PANEL
ANION GAP: 7 (ref 5–15)
BUN: 13 mg/dL (ref 8–23)
CO2: 23 mmol/L (ref 22–32)
Calcium: 8.8 mg/dL — ABNORMAL LOW (ref 8.9–10.3)
Chloride: 105 mmol/L (ref 98–111)
Creatinine, Ser: 0.75 mg/dL (ref 0.44–1.00)
GFR calc Af Amer: >60 mL/min (ref >60)
GFR calc non Af Amer: >60 mL/min (ref >60)
Glucose, Bld: 151 mg/dL — ABNORMAL HIGH (ref 70–99)
POTASSIUM: 3.9 mmol/L (ref 3.5–5.1)
Sodium: 135 mmol/L (ref 135–145)

## 2019-01-20 SURGERY — SIGMOIDOSCOPY

## 2019-01-20 SURGERY — COLECTOMY, WITH COLOSTOMY CREATION
Anesthesia: General

## 2019-01-20 MED ORDER — BUPIVACAINE LIPOSOME 1.3 % IJ SUSP
INTRAMUSCULAR | Status: DC | PRN
  Administered 2019-01-20: 20 mL

## 2019-01-20 MED ORDER — HYDROCODONE-ACETAMINOPHEN 5-325 MG PO TABS
1.0000 | ORAL_TABLET | ORAL | Status: DC | PRN
  Administered 2019-01-20 – 2019-01-21 (×3): 1 via ORAL
  Filled 2019-01-20 (×3): qty 1

## 2019-01-20 MED ORDER — ARTIFICIAL TEARS OPHTHALMIC OINT
TOPICAL_OINTMENT | OPHTHALMIC | Status: AC
  Filled 2019-01-20: qty 7

## 2019-01-20 MED ORDER — PROPOFOL 10 MG/ML IV BOLUS
INTRAVENOUS | Status: AC
  Filled 2019-01-20: qty 20

## 2019-01-20 MED ORDER — PIPERACILLIN-TAZOBACTAM 3.375 G IVPB 30 MIN
INTRAVENOUS | Status: AC | PRN
  Administered 2019-01-20: 3.375 g via INTRAVENOUS

## 2019-01-20 MED ORDER — POVIDONE-IODINE 10 % OINT PACKET
TOPICAL_OINTMENT | CUTANEOUS | Status: DC | PRN
  Administered 2019-01-20: 1 via TOPICAL

## 2019-01-20 MED ORDER — ENOXAPARIN SODIUM 40 MG/0.4ML ~~LOC~~ SOLN
40.0000 mg | SUBCUTANEOUS | Status: DC
  Administered 2019-01-21 – 2019-01-23 (×3): 40 mg via SUBCUTANEOUS
  Filled 2019-01-20 (×3): qty 0.4

## 2019-01-20 MED ORDER — KETOROLAC TROMETHAMINE 15 MG/ML IJ SOLN
INTRAMUSCULAR | Status: AC
  Filled 2019-01-20: qty 1

## 2019-01-20 MED ORDER — HYDROCODONE-ACETAMINOPHEN 7.5-325 MG PO TABS: 1.0000 | ORAL_TABLET | Freq: Once | ORAL | Status: DC | PRN

## 2019-01-20 MED ORDER — SUGAMMADEX SODIUM 200 MG/2ML IV SOLN
INTRAVENOUS | Status: AC
  Filled 2019-01-20: qty 2

## 2019-01-20 MED ORDER — IOHEXOL 350 MG/ML SOLN
100.0000 mL | Freq: Once | INTRAVENOUS | Status: AC | PRN
  Administered 2019-01-20: 100 mL via INTRAVENOUS
  Filled 2019-01-20: qty 100

## 2019-01-20 MED ORDER — PROMETHAZINE HCL 25 MG/ML IJ SOLN: 6.2500 mg | INTRAMUSCULAR | Status: DC | PRN

## 2019-01-20 MED ORDER — ROCURONIUM BROMIDE 100 MG/10ML IV SOLN
INTRAVENOUS | Status: DC | PRN
  Administered 2019-01-20: 30 mg via INTRAVENOUS

## 2019-01-20 MED ORDER — PHENYLEPHRINE 40 MCG/ML (10ML) SYRINGE FOR IV PUSH (FOR BLOOD PRESSURE SUPPORT)
PREFILLED_SYRINGE | INTRAVENOUS | Status: AC
  Filled 2019-01-20: qty 10

## 2019-01-20 MED ORDER — MEPERIDINE HCL 100 MG/ML IJ SOLN
INTRAMUSCULAR | Status: AC
  Filled 2019-01-20: qty 1

## 2019-01-20 MED ORDER — ROCURONIUM BROMIDE 10 MG/ML (PF) SYRINGE
PREFILLED_SYRINGE | INTRAVENOUS | Status: AC
  Filled 2019-01-20: qty 30

## 2019-01-20 MED ORDER — ALVIMOPAN 12 MG PO CAPS
12.0000 mg | ORAL_CAPSULE | ORAL | Status: AC
  Administered 2019-01-20: 12 mg via ORAL
  Filled 2019-01-20: qty 1

## 2019-01-20 MED ORDER — LACTATED RINGERS IV SOLN
INTRAVENOUS | Status: DC
  Administered 2019-01-20 – 2019-01-21 (×4): via INTRAVENOUS

## 2019-01-20 MED ORDER — FENTANYL CITRATE (PF) 100 MCG/2ML IJ SOLN
INTRAMUSCULAR | Status: DC | PRN
  Administered 2019-01-20: 100 ug via INTRAVENOUS

## 2019-01-20 MED ORDER — STERILE WATER FOR IRRIGATION IR SOLN
Status: DC | PRN
  Administered 2019-01-20: 1.5 mL

## 2019-01-20 MED ORDER — KETOROLAC TROMETHAMINE 30 MG/ML IJ SOLN
15.0000 mg | Freq: Once | INTRAMUSCULAR | Status: AC
  Administered 2019-01-20: 15 mg via INTRAVENOUS

## 2019-01-20 MED ORDER — CHLORHEXIDINE GLUCONATE CLOTH 2 % EX PADS: 6.0000 | MEDICATED_PAD | Freq: Once | CUTANEOUS | Status: DC

## 2019-01-20 MED ORDER — PIPERACILLIN-TAZOBACTAM 3.375 G IVPB 30 MIN
3.3750 g | Freq: Once | INTRAVENOUS | Status: AC
  Administered 2019-01-20: 3.375 g via INTRAVENOUS
  Filled 2019-01-20: qty 50

## 2019-01-20 MED ORDER — PIPERACILLIN-TAZOBACTAM 3.375 G IVPB
3.3750 g | Freq: Three times a day (TID) | INTRAVENOUS | Status: DC
  Administered 2019-01-20 – 2019-01-23 (×9): 3.375 g via INTRAVENOUS
  Filled 2019-01-20 (×9): qty 50

## 2019-01-20 MED ORDER — SUCCINYLCHOLINE CHLORIDE 200 MG/10ML IV SOSY
PREFILLED_SYRINGE | INTRAVENOUS | Status: AC
  Filled 2019-01-20: qty 20

## 2019-01-20 MED ORDER — ROCURONIUM BROMIDE 10 MG/ML (PF) SYRINGE
PREFILLED_SYRINGE | INTRAVENOUS | Status: AC
  Filled 2019-01-20: qty 10

## 2019-01-20 MED ORDER — EPHEDRINE 5 MG/ML INJ
INTRAVENOUS | Status: AC
  Filled 2019-01-20: qty 20

## 2019-01-20 MED ORDER — DIPHENHYDRAMINE HCL 50 MG/ML IJ SOLN: 12.5000 mg | Freq: Four times a day (QID) | INTRAMUSCULAR | Status: DC | PRN

## 2019-01-20 MED ORDER — MIDAZOLAM HCL 5 MG/5ML IJ SOLN
INTRAMUSCULAR | Status: AC
  Filled 2019-01-20: qty 5

## 2019-01-20 MED ORDER — HYDROMORPHONE HCL 1 MG/ML IJ SOLN: 0.2500 mg | INTRAMUSCULAR | Status: DC | PRN

## 2019-01-20 MED ORDER — LACTATED RINGERS IV SOLN
INTRAVENOUS | Status: DC
  Administered 2019-01-20: 1000 mL via INTRAVENOUS

## 2019-01-20 MED ORDER — SIMETHICONE 80 MG PO CHEW: 40.0000 mg | CHEWABLE_TABLET | Freq: Four times a day (QID) | ORAL | Status: DC | PRN

## 2019-01-20 MED ORDER — LACTATED RINGERS IV SOLN: INTRAVENOUS | Status: DC

## 2019-01-20 MED ORDER — BUPIVACAINE LIPOSOME 1.3 % IJ SUSP
INTRAMUSCULAR | Status: AC
  Filled 2019-01-20: qty 20

## 2019-01-20 MED ORDER — SUGAMMADEX SODIUM 500 MG/5ML IV SOLN
INTRAVENOUS | Status: DC | PRN
  Administered 2019-01-20: 200 mg via INTRAVENOUS

## 2019-01-20 MED ORDER — MEPERIDINE HCL 100 MG/ML IJ SOLN
INTRAMUSCULAR | Status: DC | PRN
  Administered 2019-01-20 (×2): 25 mg via INTRAVENOUS

## 2019-01-20 MED ORDER — LACTATED RINGERS IV SOLN
INTRAVENOUS | Status: DC
  Administered 2019-01-20 (×2): via INTRAVENOUS

## 2019-01-20 MED ORDER — POVIDONE-IODINE 10 % EX OINT
TOPICAL_OINTMENT | CUTANEOUS | Status: AC
  Filled 2019-01-20: qty 1

## 2019-01-20 MED ORDER — SPOT INK MARKER SYRINGE KIT
PACK | SUBMUCOSAL | Status: DC | PRN
  Administered 2019-01-20: 1 mL via SUBMUCOSAL

## 2019-01-20 MED ORDER — PHENYLEPHRINE HCL 10 MG/ML IJ SOLN
INTRAMUSCULAR | Status: DC | PRN
  Administered 2019-01-20 (×2): 80 ug via INTRAVENOUS
  Administered 2019-01-20: 40 ug via INTRAVENOUS

## 2019-01-20 MED ORDER — FENTANYL CITRATE (PF) 100 MCG/2ML IJ SOLN
INTRAMUSCULAR | Status: AC
  Filled 2019-01-20: qty 2

## 2019-01-20 MED ORDER — EPHEDRINE SULFATE 50 MG/ML IJ SOLN
INTRAMUSCULAR | Status: DC | PRN
  Administered 2019-01-20: 5 mg via INTRAVENOUS

## 2019-01-20 MED ORDER — SODIUM CHLORIDE 0.9 % IV SOLN
INTRAVENOUS | Status: DC
  Administered 2019-01-20: 11:00:00 via INTRAVENOUS

## 2019-01-20 MED ORDER — GLYCOPYRROLATE PF 0.2 MG/ML IJ SOSY
PREFILLED_SYRINGE | INTRAMUSCULAR | Status: AC
  Filled 2019-01-20: qty 2

## 2019-01-20 MED ORDER — DIPHENHYDRAMINE HCL 12.5 MG/5ML PO ELIX: 12.5000 mg | ORAL_SOLUTION | Freq: Four times a day (QID) | ORAL | Status: DC | PRN

## 2019-01-20 MED ORDER — MEPERIDINE HCL 50 MG/ML IJ SOLN: 6.2500 mg | INTRAMUSCULAR | Status: DC | PRN

## 2019-01-20 MED ORDER — LABETALOL HCL 5 MG/ML IV SOLN
INTRAVENOUS | Status: AC
  Filled 2019-01-20: qty 4

## 2019-01-20 MED ORDER — SPOT INK MARKER SYRINGE KIT: PACK | SUBMUCOSAL | Status: AC

## 2019-01-20 MED ORDER — SODIUM CHLORIDE 0.9 % IR SOLN
Status: DC | PRN
  Administered 2019-01-20 (×2): 1000 mL

## 2019-01-20 MED ORDER — PROPOFOL 10 MG/ML IV BOLUS
INTRAVENOUS | Status: DC | PRN
  Administered 2019-01-20: 20 mg via INTRAVENOUS
  Administered 2019-01-20: 130 mg via INTRAVENOUS

## 2019-01-20 MED ORDER — LIDOCAINE 2% (20 MG/ML) 5 ML SYRINGE
INTRAMUSCULAR | Status: AC
  Filled 2019-01-20: qty 5

## 2019-01-20 MED ORDER — MIDAZOLAM HCL 5 MG/5ML IJ SOLN
INTRAMUSCULAR | Status: DC | PRN
  Administered 2019-01-20: 1 mg via INTRAVENOUS
  Administered 2019-01-20: 2 mg via INTRAVENOUS

## 2019-01-20 MED ORDER — MORPHINE SULFATE (PF) 2 MG/ML IV SOLN: 2.0000 mg | INTRAVENOUS | Status: DC | PRN

## 2019-01-20 SURGICAL SUPPLY — 38 items
CHLORAPREP W/TINT 26ML (MISCELLANEOUS) ×3 IMPLANT
CLOTH BEACON ORANGE TIMEOUT ST (SAFETY) ×3 IMPLANT
COVER LIGHT HANDLE STERIS (MISCELLANEOUS) ×6 IMPLANT
COVER WAND RF STERILE (DRAPES) ×3 IMPLANT
DRAPE WARM FLUID 44X44 (DRAPE) ×3 IMPLANT
DRSG OPSITE POSTOP 4X10 (GAUZE/BANDAGES/DRESSINGS) ×3 IMPLANT
ELECT REM PT RETURN 9FT ADLT (ELECTROSURGICAL) ×3
ELECTRODE REM PT RTRN 9FT ADLT (ELECTROSURGICAL) ×1 IMPLANT
GAUZE SPONGE 4X4 12PLY STRL (GAUZE/BANDAGES/DRESSINGS) ×3 IMPLANT
GLOVE BIOGEL PI IND STRL 6.5 (GLOVE) ×2 IMPLANT
GLOVE BIOGEL PI IND STRL 7.0 (GLOVE) ×2 IMPLANT
GLOVE BIOGEL PI INDICATOR 6.5 (GLOVE) ×4
GLOVE BIOGEL PI INDICATOR 7.0 (GLOVE) ×4
GLOVE ECLIPSE 6.5 STRL STRAW (GLOVE) ×3 IMPLANT
GLOVE SURG SS PI 7.5 STRL IVOR (GLOVE) ×6 IMPLANT
GOWN STRL REUS W/TWL LRG LVL3 (GOWN DISPOSABLE) ×15 IMPLANT
HANDLE SUCTION POOLE (INSTRUMENTS) ×1 IMPLANT
INST SET MAJOR GENERAL (KITS) ×3 IMPLANT
KIT TURNOVER KIT A (KITS) ×3 IMPLANT
LIGASURE IMPACT 36 18CM CVD LR (INSTRUMENTS) ×3 IMPLANT
MANIFOLD NEPTUNE II (INSTRUMENTS) ×3 IMPLANT
NEEDLE HYPO 21X1.5 SAFETY (NEEDLE) ×3 IMPLANT
NS IRRIG 1000ML POUR BTL (IV SOLUTION) ×6 IMPLANT
PACK ABDOMINAL MAJOR (CUSTOM PROCEDURE TRAY) ×3 IMPLANT
PAD ARMBOARD 7.5X6 YLW CONV (MISCELLANEOUS) ×3 IMPLANT
RETRACTOR WND ALEXIS-O 25 LRG (MISCELLANEOUS) ×1 IMPLANT
RTRCTR WOUND ALEXIS O 25CM LRG (MISCELLANEOUS) ×3
SET BASIN LINEN APH (SET/KITS/TRAYS/PACK) ×3 IMPLANT
SPONGE LAP 18X18 RF (DISPOSABLE) ×6 IMPLANT
STAPLER VISISTAT (STAPLE) ×3 IMPLANT
SUCTION POOLE HANDLE (INSTRUMENTS) ×3
SUT NOVA NAB GS-26 0 60 (SUTURE) ×6 IMPLANT
SUT SILK 3 0 SH CR/8 (SUTURE) ×3 IMPLANT
SWAB CULTURE LIQ STUART DBL (MISCELLANEOUS) ×3 IMPLANT
SYR 30ML LL (SYRINGE) ×3 IMPLANT
SYRINGE 20CC LL (MISCELLANEOUS) ×3 IMPLANT
TRAY FOLEY CATH SILVER 16FR (SET/KITS/TRAYS/PACK) ×3 IMPLANT
TUBE ANAEROBIC PORT A CUL  W/M (MISCELLANEOUS) ×3 IMPLANT

## 2019-01-20 NOTE — Plan of Care (Signed)
SPOKE TO DAUGHTER. SHE IS AWARE OF CT SCAN AND THAT PT IS GOING TO SURGERY. SHE WILL CALL ME WITH QUESTIONS OR CONCERNS.

## 2019-01-20 NOTE — Progress Notes (Signed)
Patient drinking go-lytely at this time, tap water enema received x1

## 2019-01-20 NOTE — Progress Notes (Addendum)
PROGRESS NOTE    Susan Mercado  ZHY:[id number]  DOB: 09-22-1936  DOA: 01/19/2019 PCP: Ignatius Specking, MD   Brief Admission Hx: 83 y/o female with hypertension and hypothyroidism presented to [facility] with painless rectal bleeding.  MDM/Assessment & Plan:   1. Rectal bleeding -patient was taken for flex sig today with findings of ischemic colitis, subsequently sent for CTA and I spoke with general surgery and patient is going to OR for operative management. 2. Ischemic colitis - Pt to OR for operative management partial colectomy with colostomy placement.  I discussed with Dr. Lovell Sheehan.  3. Leukocytosis - Pt started on IV antibiotics for ischemic colitis.  4. Essential hypertension - blood pressures well controlled.  5. Hypothyroidism - continue home levothyroxine.   DVT prophylaxis: SCDs Code Status: Full  Family Communication: patient  Disposition Plan: Inpatient for IV antibiotics   Consultants:  GI  Surgery   Procedures:  EGD 3/25  Partial colectomy 3/25  Antimicrobials:  Zosyn 3/25 >  Subjective Pt tolerated bowel prep for colonoscopy  Objective: Vitals:   01/20/19 1255 01/20/19 1300 01/20/19 1403 01/20/19 1506  BP: (!) 77/51 (!) 93/55 100/65 96/63  Pulse: 85 79 86   Resp: (!) 24 19 18    Temp:      TempSrc:      SpO2: 94% 95% 98% 94%  Weight:      Height:        Intake/Output Summary (Last 24 hours) at 01/20/2019 1509 Last data filed at 01/20/2019 0704 Gross per 24 hour  Intake 2060 ml  Output -  Net 2060 ml   Filed Weights   01/19/19 0952  Weight: 69.1 kg   Exam:  General exam: awake, alert, NAD, cooperative, NAD.  Respiratory system: Clear. No increased work of breathing. Cardiovascular system: normal S1 & S2 heard. Gastrointestinal system: Abdomen is nondistended, soft and nontender. Central nervous system : Alert and oriented. No focal neurological deficits.  Data Reviewed: Basic Metabolic Panel: Recent Labs  Lab 01/19/19  1118 01/20/19 0422  NA 137 135  K 4.6 3.9  CL 101 105  CO2 24 23  GLUCOSE 153* 151*  BUN 17 13  CREATININE 0.68 0.75  CALCIUM 9.5 8.8*   Liver Function Tests: No results for input(s): AST, ALT, ALKPHOS, BILITOT, PROT, ALBUMIN in the last 168 hours. No results for input(s): LIPASE, AMYLASE in the last 168 hours. No results for input(s): AMMONIA in the last 168 hours. CBC: Recent Labs  Lab 01/19/19 1118 01/20/19 0422  WBC 16.8* 21.7*  HGB 15.2* 14.5  HCT 44.4 43.2  MCV 89.5 90.0  PLT 196 195   Cardiac Enzymes: No results for input(s): CKTOTAL, CKMB, CKMBINDEX, TROPONINI in the last 168 hours. CBG (last 3)  No results for input(s): GLUCAP in the last 72 hours. No results found for this or any previous visit (from the past 240 hour(s)).   Studies: Dg Chest Port 1 View  Result Date: 01/19/2019 CLINICAL DATA:  Leukocytosis EXAM: PORTABLE CHEST 1 VIEW COMPARISON:  None FINDINGS: Cardiac shadows within normal limits. The lungs are well aerated bilaterally. Minimal left basilar atelectasis is seen. No focal confluent infiltrate is noted. No bony abnormality is seen. IMPRESSION: Minimal left basilar atelectasis. Electronically Signed   By: Alcide Clever M.D.   On: 01/19/2019 13:54   Scheduled Meds: . Chlorhexidine Gluconate Cloth  6 each Topical Once   And  . Chlorhexidine Gluconate Cloth  6 each Topical Once  . [MAR Hold] levothyroxine  75 mcg Oral QAC breakfast  . meperidine      . [MAR Hold] metoprolol succinate  75 mg Oral BID  . midazolam      . [MAR Hold] pantoprazole  40 mg Oral Daily  . [MAR Hold] sodium chloride flush  3 mL Intravenous Q12H  . spot ink marker       Continuous Infusions: . lactated ringers 75 mL/hr at 01/20/19 1423  . lactated ringers 1,000 mL (01/20/19 1507)    Principal Problem:   Painless rectal bleeding Active Problems:   Hypertension   Hypothyroidism   Leukocytosis  Time spent:   Standley Dakins, MD [facility] 01/20/2019,  3:09 PM    LOS: 1 day  How to contact the [facility] Attending or Consulting provider 7A - 7P or covering provider during after hours 7P -7A, for this patient?  1. Check the care team in [facility] and look for a) attending/consulting [facility] provider listed and b) the [facility] team listed 2. Log into [email or web address] and use [place]'s universal password to access. If you do not have the password, please contact the hospital operator. 3. Locate the [facility] provider you are looking for under [facility] and page to a number that you can be directly reached. 4. If you still have difficulty reaching the provider, please page the [facility] (Director on Call) for the Hospitalists listed on amion for assistance.

## 2019-01-20 NOTE — Plan of Care (Signed)
Spoke to daughter in law ROSEMARY Falter: [phone]. SHE IS AWARE OF SIGMOIDOSCOPY FINDINGS AND PLAN FOR CTA, ABX, 3-5 DAY HOSPITAL STAY, AND POSSIBLY SURGERY BY DR. Lovell Sheehan.

## 2019-01-20 NOTE — Op Note (Signed)
Patient:  Susan Mercado  DOB:  September 12, 1936  MRN:  [id number]   Preop Diagnosis: Ischemic colitis  Postop Diagnosis: Colitis of descending colon  Procedure: Exploratory laparotomy  Surgeon: Franky Macho, MD  Assistant: Larae Grooms, MD  Anes: General endotracheal  Indications: Patient is an 83 year old white female who was admitted to the hospital with blood per rectum.  She underwent a sigmoidoscopy today by Dr. Darrick Penna and was found to have ischemic mucosa.  She underwent a stat CT angios of the abdomen which showed that no left colic artery was identified.  It appears that the blood flow to the descending colon is from the inferior mesenteric artery.  This was noted to be patent.  Her celiac trunk, superior mesenteric artery, and inferior mesenteric arteries are widely patent.  There was pericolonic fluid present around the descending colon.  In addition, she had a leukocytosis of 21,000.  Given the concern for impending ischemic bowel, it was elected to proceed with exploratory laparotomy with possible partial colectomy with colostomy.  The risks and benefits of the procedure including bleeding, infection, cardiopulmonary difficulties and the possibility of a temporary colostomy were fully explained to the patient, who gave informed consent.  Procedure note: The patient was placed in the supine position.  After induction of general endotracheal anesthesia, the abdomen was prepped and draped using usual sterile technique with ChloraPrep.  Surgical site confirmation was performed.  The midline incision was made from just above the umbilicus to below the umbilicus.  The peritoneal cavity was entered into without difficulty.  Upon entering the peritoneal cavity, cloudy peritoneal fluid was found.  It did not appear purulent in nature, but aerobic and anaerobic cultures were taken and sent to microbiology.  The small bowel was inspected and noted to be within normal limits.  The ascending colon  and transverse colon regions appear to be within normal limits.  Starting from the splenic flexure down to the distal sigmoid colon, erythematous and thickened colonic wall was found.  There was no evidence of full-thickness necrosis or cyanosis of the descending colon.  Her uterus and ovaries are within normal limits.  As there was no impending ischemic or necrotic colon seen, it was elected to not proceed with a partial colectomy.  The abdominal cavity was copiously irrigated with normal saline.  The fascia was reapproximated using a looped 0 Novafil running suture.  Subcutaneous layer was irrigated with normal saline.  Exparel was instilled into the surrounding wound.  The skin was closed using staples.  Betadine ointment and dry sterile dressing were applied.  All tape and needle counts were correct at the end of the procedure.  Patient was extubated in the operating room and transferred to PACU in stable condition.  Complications: None  EBL: Minimal  Specimen: Aerobic and anaerobic cultures of peritoneal fluid

## 2019-01-20 NOTE — Anesthesia Procedure Notes (Signed)
Procedure Name: Intubation Date/Time: 01/20/2019 3:23 PM Performed by: Vista Deck, CRNA Pre-anesthesia Checklist: Patient identified, Patient being monitored, Timeout performed, Emergency Drugs available and Suction available Patient Re-evaluated:Patient Re-evaluated prior to induction Oxygen Delivery Method: Circle System Utilized Preoxygenation: Pre-oxygenation with 100% oxygen Induction Type: IV induction, Rapid sequence and Cricoid Pressure applied Laryngoscope Size: Mac and 3 Grade View: Grade I Tube type: Oral Tube size: 7.0 mm Number of attempts: 1 Airway Equipment and Method: stylet Placement Confirmation: ETT inserted through vocal cords under direct vision,  positive ETCO2 and breath sounds checked- equal and bilateral Secured at: 21 cm Tube secured with: Tape Dental Injury: Teeth and Oropharynx as per pre-operative assessment

## 2019-01-20 NOTE — Progress Notes (Signed)
Patient has completed approximately 3/4 gallon golytely. Tap water enema X 1. Watery light brown stool. Encouraged to complete golytely. One additional tap water enema. WBC up today. U/A ordered but not completed. CXR with minimal left basilar atelectasis. Encouraged u/a to be collected.    Leanna Battles. [facility] [phone] 3/25/20208:26 AM

## 2019-01-20 NOTE — Anesthesia Preprocedure Evaluation (Signed)
Anesthesia Evaluation    Airway Mallampati: II       Dental  (+) Teeth Intact   Pulmonary    breath sounds clear to auscultation       Cardiovascular hypertension, On Medications  Rhythm:regular     Neuro/Psych    GI/Hepatic   Endo/Other  Hypothyroidism   Renal/GU      Musculoskeletal   Abdominal   Peds  Hematology   Anesthesia Other Findings NSR at [age] yr old resents with painless lower GI Bleed Ischemic bowel with increasing WBCs  Reproductive/Obstetrics                             Anesthesia Physical Anesthesia Plan  ASA: IV and emergent  Anesthesia Plan: General   Post-op Pain Management:    Induction:   PONV Risk Score and Plan:   Airway Management Planned:   Additional Equipment:   Intra-op Plan:   Post-operative Plan:   Informed Consent: I have reviewed the patients History and Physical, chart, labs and discussed the procedure including the risks, benefits and alternatives for the proposed anesthesia with the patient or authorized representative who has indicated his/her understanding and acceptance.     Dental Advisory Given  Plan Discussed with: Anesthesiologist  Anesthesia Plan Comments:         Anesthesia Quick Evaluation

## 2019-01-20 NOTE — Transfer of Care (Signed)
Immediate Anesthesia Transfer of Care Note  Patient: Susan Mercado  Procedure(s) Performed: EXPLORATORY LAPAROTOMY (N/A )  Patient Location: PACU  Anesthesia Type:General  Level of Consciousness: awake, alert  and patient cooperative  Airway & Oxygen Therapy: Patient Spontanous Breathing  Post-op Assessment: Report given to RN and Post -op Vital signs reviewed and stable  Post vital signs: Reviewed and stable  Last Vitals:  Vitals Value Taken Time  BP    Temp    Pulse 125 01/20/2019  4:14 PM  Resp 16 01/20/2019  4:15 PM  SpO2 90 % 01/20/2019  4:14 PM  Vitals shown include unvalidated device data.  Last Pain:  Vitals:   01/20/19 1506  TempSrc:   PainSc: 0-No pain         Complications: No apparent anesthesia complications

## 2019-01-20 NOTE — Op Note (Signed)
[facility] Patient Name: Susan Mercado Procedure Date: 01/20/2019 11:52 AM MRN: [id number] Date of Birth: 1936/06/11 Attending MD: Jonette Eva MD, MD CSN: [id number] Age: 83 Admit Type: Inpatient Procedure:                Flexible Sigmoidoscopy WITH COLD FORCEPS                            BIOPSY/SPOT TATTOO Indications:              Hematochezia Providers:                Jonette Eva MD, MD, Buel Ream. Thomasena Edis RN, RN,                            Edythe Clarity, Technician Referring MD:             Ignatius Specking Medicines:                Meperidine 50 mg IV, Midazolam 3 mg IV Complications:            No immediate complications. Estimated Blood Loss:     Estimated blood loss was minimal. Procedure:                Pre-Anesthesia Assessment:                           - Prior to the procedure, a History and Physical                            was performed, and patient medications and                            allergies were reviewed. The patient's tolerance of                            previous anesthesia was also reviewed. The risks                            and benefits of the procedure and the sedation                            options and risks were discussed with the patient.                            All questions were answered, and informed consent                            was obtained. Prior Anticoagulants: The patient has                            taken no previous anticoagulant or antiplatelet                            agents. ASA Grade Assessment: II - A patient with  mild systemic disease. After reviewing the risks                            and benefits, the patient was deemed in                            satisfactory condition to undergo the procedure.                            After obtaining informed consent, the scope was                            passed under direct vision. The PCF-[id number]                            ([id number]) scope  was introduced through the anus and                            advanced to the the sigmoid colon. After obtaining                            informed consent, the scope was passed under direct                            vision.The colonoscopy was performed with                            difficulty due to {ISCHEMIC GUT}. The quality of                            the bowel preparation was good. The SIGMOID COLON                            AND rectum ARE photographed. Scope In: 12:30:01 PM Scope Out: 12:40:46 PM Total Procedure Duration: 0 hours 10 minutes 45 seconds  Findings:      A continuous area of nonbleeding ulcerated mucosa with stigmata of       recent bleeding was present in the recto-sigmoid colon(BEGINNING ~17 CM       FROM THE ANAL VERGE WITH AREA OF ULCERATION/NORMAL MUCOSA).1 CC TATTOO       PLACED AT 17 CM. FROM 25 CM TO 40 CM (Sigmoid colon) PT HAS CONTIGUOUS       CIRCUMFERENTIAL DISCOLORATION CONSISTENT WITH INFARCTION. UNABLE TO       APPRECIATE NECROSIS. DISTAL SIGMOID WAS biopsied with a cold forceps for       histology.      External and internal hemorrhoids were found. Impression:               - RECTAL BLEEDING DUE TO ISCHEMIC COLITIS WITH                            EVIDENCE FOR INFARCTION.                           - External and internal hemorrhoids.  Moderate Sedation:      Moderate (conscious) sedation was administered by the endoscopy nurse       and supervised by the endoscopist. The following parameters were       monitored: oxygen saturation, heart rate, blood pressure, and response       to care. Total physician intraservice time was 22 minutes. Recommendation:           - Return patient to hospital ward for ongoing care.                           - NPO. STAT CT ANGIO. ZOSYN 3.375 GM IV x1                           - Continue present medications.                           - Await pathology results. DISCUSSED WITHDR.                            JENKINS.                            - Repeat colonoscopy IN 6-12 MOS for surveillance. Procedure Code(s):        --- Professional ---                           [phone], 52, Colonoscopy, flexible; with biopsy,                            single or multiple                           G0500, Moderate sedation services provided by the                            same physician or other qualified health care                            professional performing a gastrointestinal                            endoscopic service that sedation supports,                            requiring the presence of an independent trained                            observer to assist in the monitoring of the                            patient's level of consciousness and physiological                            status; initial 15 minutes of intra-service time;  patient age 59 years or older (additional time may                            be reported with [id number], as appropriate) Diagnosis Code(s):        --- Professional ---                           K63.3, Ulcer of intestine                           K64.8, Other hemorrhoids                           K92.1, Melena (includes Hematochezia) CPT copyright 2018 American Medical Association. All rights reserved. The codes documented in this report are preliminary and upon coder review may  be revised to meet current compliance requirements. Jonette EvaSandi Fields, MD Jonette EvaSandi Fields MD, MD 01/20/2019 12:59:29 PM This report has been signed electronically. Number of Addenda: 0

## 2019-01-20 NOTE — Interval H&P Note (Signed)
History and Physical Interval Note:  01/20/2019 12:12 PM  Susan Mercado  has presented today for surgery, with the diagnosis of rectal bleeding.  The various methods of treatment have been discussed with the patient and family. After consideration of risks, benefits and other options for treatment, the patient has consented to  Procedure(s): COLONOSCOPY (N/A) as a surgical intervention.  The patient's history has been reviewed, patient examined, no change in status, stable for surgery.  I have reviewed the patient's chart and labs.  Questions were answered to the patient's satisfaction.   REVIEWED. PT SEEN AND EXAMINED. DISCUSSED PROCEDURE, BENEFITS, & RISKS: < 1% chance of medication reaction, bleeding, perforation, ASPIRATION, or rupture of spleen/liver requiring surgery to fix it and missed polyps < 1 cm 10-20% of the time.   [employer]

## 2019-01-20 NOTE — Anesthesia Postprocedure Evaluation (Signed)
Anesthesia Post Note  Patient: Susan Mercado  Procedure(s) Performed: EXPLORATORY LAPAROTOMY (N/A )  Patient location during evaluation: PACU Anesthesia Type: General Level of consciousness: awake and alert and patient cooperative Pain management: satisfactory to patient Vital Signs Assessment: post-procedure vital signs reviewed and stable Respiratory status: spontaneous breathing Cardiovascular status: stable Postop Assessment: no apparent nausea or vomiting Anesthetic complications: no     Last Vitals:  Vitals:   01/20/19 1615 01/20/19 1630  BP: (!) 88/74 105/66  Pulse: 75 76  Resp: 16 (!) 21  Temp:    SpO2: 90% 100%    Last Pain:  Vitals:   01/20/19 1612  TempSrc:   PainSc: 2                  Enid Maultsby

## 2019-01-20 NOTE — Consult Note (Signed)
Reason for Consult: Ischemic colon Referring Physician: Dr. Laural Benes, Susan Mercado is an 83 y.o. female.  HPI: Patient is an 83 year old white female who was in good health when she presented to [facility] after having a large amount of blood per rectum.  She denies any significant abdominal pain.  She states she is somewhat bloated.  She was seen in the emergency room at [facility] and transferred to [facility] so that she could undergo GI consultation.  Dr. Darrick Penna of GI did a sigmoidoscopy and found ischemic black mucosa in the descending colon.  She did not advance past this point.  A CT angios was performed which revealed no significant vessel disease.  We could not identify the left colic artery, but she had a prominent IMA which was feeding the left colon.  She has significant thickening of the descending colon with pericolonic fluid present.  No free air is present.  Patient states she has minimal abdominal pain and only complains of bloating.  She has never had an episode like this before.  Past Medical History:  Diagnosis Date  . Hypertension   . Hypothyroidism     Past Surgical History:  Procedure Laterality Date  . SHOULDER SURGERY  1990   bil shoulder spurs removed  . SHOULDER SURGERY  1992   spurs    Family History  Problem Relation Age of Onset  . Colon cancer Neg Hx   . Colon polyps Neg Hx     Social History:  reports that she has never smoked. She has never used smokeless tobacco. She reports previous alcohol use. She reports previous drug use.  Allergies:  Allergies  Allergen Reactions  . Lisinopril Swelling  . Shellfish Allergy Nausea And Vomiting    Medications: I have reviewed the patient's current medications.  Results for orders placed or performed during the hospital encounter of 01/19/19 (from the past 48 hour(s))  Basic metabolic panel     Status: Abnormal   Collection Time: 01/19/19 11:18 AM  Result Value Ref Range   Sodium 137 135 - 145 mmol/L   Potassium 4.6 3.5 - 5.1 mmol/L   Chloride 101 98 - 111 mmol/L   CO2 24 22 - 32 mmol/L   Glucose, Bld 153 (H) 70 - 99 mg/dL   BUN 17 8 - 23 mg/dL   Creatinine, Ser 6.46 0.44 - 1.00 mg/dL   Calcium 9.5 8.9 - 80.3 mg/dL   GFR calc non Af Amer >60 >60 mL/min   GFR calc Af Amer >60 >60 mL/min   Anion gap 12 5 - 15    Comment: Performed at [facility], [address], [place], [place] [address]  Type and screen [facility]     Status: None   Collection Time: 01/19/19 11:18 AM  Result Value Ref Range   ABO/RH(D) A POS    Antibody Screen NEG    Sample Expiration      01/22/2019 Performed at [facility], [address], [place], [place] [address]   CBC     Status: Abnormal   Collection Time: 01/19/19 11:18 AM  Result Value Ref Range   WBC 16.8 (H) 4.0 - 10.5 K/uL   RBC 4.96 3.87 - 5.11 MIL/uL   Hemoglobin 15.2 (H) 12.0 - 15.0 g/dL   HCT 37.0 48.8 - 89.1 %   MCV 89.5 80.0 - 100.0 fL   MCH 30.6 26.0 - 34.0 pg   MCHC 34.2 30.0 - 36.0 g/dL  RDW 13.6 11.5 - 15.5 %   Platelets 196 150 - 400 K/uL   nRBC 0.0 0.0 - 0.2 %    Comment: Performed at [facility], [address], [place], [place] [address]  CBC     Status: Abnormal   Collection Time: 01/20/19  4:22 AM  Result Value Ref Range   WBC 21.7 (H) 4.0 - 10.5 K/uL   RBC 4.80 3.87 - 5.11 MIL/uL   Hemoglobin 14.5 12.0 - 15.0 g/dL   HCT 50.3 88.8 - 28.0 %   MCV 90.0 80.0 - 100.0 fL   MCH 30.2 26.0 - 34.0 pg   MCHC 33.6 30.0 - 36.0 g/dL   RDW 03.4 91.7 - 91.5 %   Platelets 195 150 - 400 K/uL   nRBC 0.0 0.0 - 0.2 %    Comment: Performed at [facility], [address], [place], [place] [address]  Basic metabolic panel     Status: Abnormal   Collection Time: 01/20/19  4:22 AM  Result Value Ref Range   Sodium 135 135 - 145 mmol/L   Potassium 3.9 3.5 - 5.1 mmol/L   Chloride 105 98 - 111 mmol/L   CO2 23 22 - 32 mmol/L   Glucose, Bld 151 (H) 70 - 99 mg/dL   BUN 13 8 - 23 mg/dL   Creatinine, Ser  9.48 0.44 - 1.00 mg/dL   Calcium 8.8 (L) 8.9 - 10.3 mg/dL   GFR calc non Af Amer >60 >60 mL/min   GFR calc Af Amer >60 >60 mL/min   Anion gap 7 5 - 15    Comment: Performed at [facility], [address], [place], [place] [address]    Dg Chest Port 1 View  Result Date: 01/19/2019 CLINICAL DATA:  Leukocytosis EXAM: PORTABLE CHEST 1 VIEW COMPARISON:  None FINDINGS: Cardiac shadows within normal limits. The lungs are well aerated bilaterally. Minimal left basilar atelectasis is seen. No focal confluent infiltrate is noted. No bony abnormality is seen. IMPRESSION: Minimal left basilar atelectasis. Electronically Signed   By: Alcide Clever M.D.   On: 01/19/2019 13:54    ROS:  Pertinent items are noted in HPI.  Blood pressure 100/65, pulse 86, temperature 98.3 F (36.8 C), temperature source Oral, resp. rate 18, height 5' 5.5" (1.664 m), weight 69.1 kg, SpO2 98 %. Physical Exam: Pleasant well-developed well-nourished white female no acute distress Head is normocephalic, atraumatic Lungs clear to auscultation with equal breath sounds bilaterally Heart examination reveals regular rate and rhythm without S3, S4, murmurs Abdomen is soft but distended.  No specific tenderness or rigidity is noted.  CT scan images personally reviewed  Assessment/Plan: Impression: Ischemic colitis.  Given her increase in her leukocytosis as well as the sigmoidoscopy findings, I am scared that she may end up deteriorating quickly with peritonitis.  I did explain this to the patient.  We will proceed with partial colectomy with colostomy.  The risks and benefits of the procedure including bleeding, infection, cardiopulmonary difficulties, and the need for a temporary colostomy were fully explained to the patient, who gave informed consent.  Franky Macho 01/20/2019, 2:35 PM

## 2019-01-21 ENCOUNTER — Encounter (HOSPITAL_COMMUNITY): Payer: Self-pay | Admitting: General Surgery

## 2019-01-21 ENCOUNTER — Inpatient Hospital Stay (HOSPITAL_COMMUNITY): Payer: Medicare Other

## 2019-01-21 ENCOUNTER — Telehealth: Payer: Self-pay | Admitting: Gastroenterology

## 2019-01-21 DIAGNOSIS — K869 Disease of pancreas, unspecified: Secondary | ICD-10-CM

## 2019-01-21 DIAGNOSIS — I361 Nonrheumatic tricuspid (valve) insufficiency: Secondary | ICD-10-CM

## 2019-01-21 DIAGNOSIS — K55039 Acute (reversible) ischemia of large intestine, extent unspecified: Principal | ICD-10-CM

## 2019-01-21 LAB — URINALYSIS, ROUTINE W REFLEX MICROSCOPIC
Bacteria, UA: NONE SEEN
Bilirubin Urine: NEGATIVE
Glucose, UA: NEGATIVE mg/dL
KETONES UR: 5 mg/dL — AB
Leukocytes,Ua: NEGATIVE
Nitrite: NEGATIVE
PH: 5 (ref 5.0–8.0)
Protein, ur: NEGATIVE mg/dL
Specific Gravity, Urine: 1.034 — ABNORMAL HIGH (ref 1.005–1.030)

## 2019-01-21 LAB — BASIC METABOLIC PANEL
Anion gap: 8 (ref 5–15)
BUN: 10 mg/dL (ref 8–23)
CO2: 24 mmol/L (ref 22–32)
Calcium: 8.1 mg/dL — ABNORMAL LOW (ref 8.9–10.3)
Chloride: 104 mmol/L (ref 98–111)
Creatinine, Ser: 0.67 mg/dL (ref 0.44–1.00)
GFR calc Af Amer: >60 mL/min (ref >60)
GFR calc non Af Amer: >60 mL/min (ref >60)
Glucose, Bld: 112 mg/dL — ABNORMAL HIGH (ref 70–99)
Potassium: 3.6 mmol/L (ref 3.5–5.1)
Sodium: 136 mmol/L (ref 135–145)

## 2019-01-21 LAB — PHOSPHORUS: Phosphorus: 2.4 mg/dL — ABNORMAL LOW (ref 2.5–4.6)

## 2019-01-21 LAB — ECHOCARDIOGRAM LIMITED
Height: 65.5 in
Weight: 2439 oz

## 2019-01-21 LAB — CBC
HEMATOCRIT: 36.8 % (ref 36.0–46.0)
Hemoglobin: 12.1 g/dL (ref 12.0–15.0)
MCH: 30.4 pg (ref 26.0–34.0)
MCHC: 32.9 g/dL (ref 30.0–36.0)
MCV: 92.5 fL (ref 80.0–100.0)
Platelets: 147 10*3/uL — ABNORMAL LOW (ref 150–400)
RBC: 3.98 MIL/uL (ref 3.87–5.11)
RDW: 14.1 % (ref 11.5–15.5)
WBC: 15.6 10*3/uL — ABNORMAL HIGH (ref 4.0–10.5)
nRBC: 0 % (ref 0.0–0.2)

## 2019-01-21 LAB — MAGNESIUM: Magnesium: 1.8 mg/dL (ref 1.7–2.4)

## 2019-01-21 NOTE — Progress Notes (Addendum)
Assisted up to chair.  Had incontinent stool before getting up.  Liquid rust colored.  Text chatted with Dr. Lovell Sheehan concerning this and IV fluids.  Continues to deny pain . Has walked with assist of tech to bathroom twice today.  Linen changed

## 2019-01-21 NOTE — Progress Notes (Signed)
CC'D TO PCP °

## 2019-01-21 NOTE — Progress Notes (Signed)
1 Day Post-Op  Subjective: Patient feels great.  Minimal incisional pain.  No nausea or vomiting noted.  Has not had flatus yet.  Objective: Vital signs in last 24 hours: Temp:  [98.2 F (36.8 C)-99.5 F (37.5 C)] 98.9 F (37.2 C) (03/26 0543) Pulse Rate:  [74-92] 88 (03/26 0543) Resp:  [15-27] 16 (03/26 0543) BP: (77-135)/(51-97) 109/97 (03/26 0543) SpO2:  [90 %-100 %] 94 % (03/26 0543) Last BM Date: 01/20/19  Intake/Output from previous day: 03/25 0701 - 03/26 0700 In: 3573.2 [P.O.:1000; I.V.:2446.8; IV Piggyback:126.4] Out: 925 [Urine:900; Blood:25] Intake/Output this shift: No intake/output data recorded.  General appearance: alert, cooperative and no distress Resp: clear to auscultation bilaterally Cardio: regular rate and rhythm, S1, S2 normal, no murmur, click, rub or gallop GI: Soft, occasional bowel sounds appreciated.  Incision healing well.  Lab Results:  Recent Labs    01/20/19 0422 01/21/19 0457  WBC 21.7* 15.6*  HGB 14.5 12.1  HCT 43.2 36.8  PLT 195 147*   BMET Recent Labs    01/20/19 0422 01/21/19 0457  NA 135 136  K 3.9 3.6  CL 105 104  CO2 23 24  GLUCOSE 151* 112*  BUN 13 10  CREATININE 0.75 0.67  CALCIUM 8.8* 8.1*   PT/INR No results for input(s): LABPROT, INR in the last 72 hours.  Studies/Results: Dg Chest Port 1 View  Result Date: 01/19/2019 CLINICAL DATA:  Leukocytosis EXAM: PORTABLE CHEST 1 VIEW COMPARISON:  None FINDINGS: Cardiac shadows within normal limits. The lungs are well aerated bilaterally. Minimal left basilar atelectasis is seen. No focal confluent infiltrate is noted. No bony abnormality is seen. IMPRESSION: Minimal left basilar atelectasis. Electronically Signed   By: Alcide Clever M.D.   On: 01/19/2019 13:54   Ct Angio Abd/pel W/ And/or W/o  Result Date: 01/20/2019 CLINICAL DATA:  Rectal bleeding due to ischemic colitis. EXAM: CT ANGIOGRAPHY ABDOMEN AND PELVIS WITH CONTRAST AND WITHOUT CONTRAST TECHNIQUE: Multidetector  CT imaging of the abdomen and pelvis was performed using the standard protocol during bolus administration of intravenous contrast. Multiplanar reconstructed images and MIPs were obtained and reviewed to evaluate the vascular anatomy. CONTRAST:  OMNIPAQUE IOHEXOL 350 MG/ML SOLN COMPARISON:  Chest radiograph 324 2020 FINDINGS: VASCULAR Aorta: Mild atherosclerotic disease in the abdominal aorta without aneurysm or dissection. Celiac: Patent without evidence of aneurysm, dissection, vasculitis or significant stenosis. SMA: Patent without evidence of aneurysm, dissection, vasculitis or significant stenosis. Renals: Both renal arteries are patent without evidence of aneurysm, dissection, vasculitis, fibromuscular dysplasia or significant stenosis. IMA: Patent without evidence of aneurysm, dissection, vasculitis or significant stenosis. Inflow: Patent without evidence of aneurysm, dissection, vasculitis or significant stenosis. Proximal Outflow: Proximal femoral arteries are patent bilaterally. Veins: Portal venous system is patent. The mesenteric veins are widely patent. Narrowing of the external iliac veins may be related to the urinary bladder distension. IVC and renal veins are patent. Review of the MIP images confirms the above findings. NON-VASCULAR Lower chest: Atelectasis or scarring in the lingula. No large pleural effusions. Probable small hiatal hernia. Hepatobiliary: Lobulated cystic structure in the left hepatic lobe measuring 3.2 x 2.1 cm. Small amount of perihepatic ascites. Gallbladder is mildly distended without inflammatory changes. Portal venous system is patent. No suspicious liver lesions. Pancreas: Low-density or cystic lesion in the distal pancreatic body / tail region. This is best seen on the coronal reformats, sequence 9, image 49. Lesion measures up to 1.7 cm. There is no significant pancreatic duct dilatation. Spleen: Normal in size  without focal abnormality. Adrenals/Urinary Tract:  Normal adrenal glands. Normal appearance of both kidneys without hydronephrosis. No suspicious renal lesions. Urinary bladder is distended. Stomach/Bowel: Wall thickening in sigmoid colon and descending colon including the splenic flexure. Pericolonic edema involving the descending colon. There appears to be mucosal enhancement involving the segments of colon with wall thickening. There is normal mucosal enhancement in the rectum containing a small amount of fluid. Normal appearance of the right colon and transverse colon. Appendix is not confidently identified but the cecum is slightly anteriorly located. Small hiatal hernia. Normal appearance of the stomach. Normal appearance of small bowel. Lymphatic: No significant lymph node enlargement in the abdomen or pelvis. Reproductive: Low-density structure involving the left ovary could represent a cystic structure measuring to 4.1 cm on sequence 9, image 57. This is indeterminate for a patient of this age. No gross abnormality to the uterus. Other: Small amount of free fluid in the pelvis. Small amount of free fluid in the lower abdominal quadrants. Musculoskeletal: No acute bone abnormality. IMPRESSION: VASCULAR 1. Minimal atherosclerotic disease in the abdomen and pelvis. Negative for an arterial aneurysm, dissection or emboli. 2. Main visceral arteries are widely patent. Mesenteric veins are patent. NON-VASCULAR 1. Wall thickening involving the splenic flexure, descending colon and sigmoid colon. Imaging findings are suggestive for colitis. Etiology for the colitis is not clear based on the imaging findings. 2. Small amount of ascites in the abdomen and pelvis likely secondary to the inflammatory changes in the left colon. 3. **An incidental finding of potential clinical significance has been found. Indeterminate 1.7 cm low-density, probable cystic lesion involving the distal pancreatic body region. Recommend six-month follow up imaging with MRI. 4. **An incidental  finding of potential clinical significance has been found. Enlarged left ovary and possible cystic structure. Recommend follow-up pelvic ultrasound to evaluate the left ovary. ** Electronically Signed   By: Richarda Overlie M.D.   On: 01/20/2019 16:20    Anti-infectives: Anti-infectives (From admission, onward)   Start     Dose/Rate Route Frequency Ordered Stop   01/20/19 1715  piperacillin-tazobactam (ZOSYN) IVPB 3.375 g     3.375 g 12.5 mL/hr over 240 Minutes Intravenous Every 8 hours 01/20/19 1713     01/20/19 1300  piperacillin-tazobactam (ZOSYN) IVPB 3.375 g     3.375 g 100 mL/hr over 30 Minutes Intravenous  Once 01/20/19 1250 01/20/19 1454   01/20/19 1300  piperacillin-tazobactam (ZOSYN) IVPB     over 30 Minutes  Continuous PRN 01/20/19 1345 01/20/19 1300      Assessment/Plan: s/p Procedure(s): EXPLORATORY LAPAROTOMY Impression: Stable on postoperative day 1.  At this point, she has been treated like she had a transient ischemic event to the descending colon.  Will continue Zosyn at this time until culture results are seen.  Her leukocytosis is resolving.  Will advance to clear liquid diet.  LOS: 2 days    Franky Macho 01/21/2019

## 2019-01-21 NOTE — Progress Notes (Signed)
Has declined any pain medication today and is tolerating clear liquid diet well.  Up with assist of tech to void after cath removal and patient also had bloody bm.  Honeycomb dressing to abd dry and intact.

## 2019-01-21 NOTE — Progress Notes (Signed)
Subjective:  Feels a lot better. Nausea improved. Very little abdominal discomfort.   Objective: Vital signs in last 24 hours: Temp:  [98.2 F (36.8 C)-99.5 F (37.5 C)] 98.9 F (37.2 C) (03/26 0543) Pulse Rate:  [74-92] 88 (03/26 0543) Resp:  [15-27] 16 (03/26 0543) BP: (77-135)/(51-97) 109/97 (03/26 0543) SpO2:  [90 %-100 %] 94 % (03/26 0543) Last BM Date: 01/20/19 General:   Alert,  Well-developed, well-nourished, pleasant and cooperative in NAD Head:  Normocephalic and atraumatic. Eyes:  Sclera clear, no icterus.  Abdomen:  Soft,  nondistended.   Normal bowel sounds, without guarding, and without rebound.   Extremities:  Without clubbing, deformity or edema. Neurologic:  Alert and  oriented x4;  grossly normal neurologically. Skin:  Intact without significant lesions or rashes. Psych:  Alert and cooperative. Normal mood and affect.  Intake/Output from previous day: 03/25 0701 - 03/26 0700 In: 3573.2 [P.O.:1000; I.V.:2446.8; IV Piggyback:126.4] Out: 925 [Urine:900; Blood:25] Intake/Output this shift: No intake/output data recorded.  Lab Results: CBC Recent Labs    01/19/19 1118 01/20/19 0422 01/21/19 0457  WBC 16.8* 21.7* 15.6*  HGB 15.2* 14.5 12.1  HCT 44.4 43.2 36.8  MCV 89.5 90.0 92.5  PLT 196 195 147*   BMET Recent Labs    01/19/19 1118 01/20/19 0422 01/21/19 0457  NA 137 135 136  K 4.6 3.9 3.6  CL 101 105 104  CO2 24 23 24   GLUCOSE 153* 151* 112*  BUN 17 13 10   CREATININE 0.68 0.75 0.67  CALCIUM 9.5 8.8* 8.1*   LFTs No results for input(s): BILITOT, BILIDIR, IBILI, ALKPHOS, AST, ALT, PROT, ALBUMIN in the last 72 hours. No results for input(s): LIPASE in the last 72 hours. PT/INR No results for input(s): LABPROT, INR in the last 72 hours.    Imaging Studies: Dg Chest Port 1 View  Result Date: 01/19/2019 CLINICAL DATA:  Leukocytosis EXAM: PORTABLE CHEST 1 VIEW COMPARISON:  None FINDINGS: Cardiac shadows within normal limits. The lungs are well  aerated bilaterally. Minimal left basilar atelectasis is seen. No focal confluent infiltrate is noted. No bony abnormality is seen. IMPRESSION: Minimal left basilar atelectasis. Electronically Signed   By: Alcide Clever M.D.   On: 01/19/2019 13:54   Ct Angio Abd/pel W/ And/or W/o  Result Date: 01/20/2019 CLINICAL DATA:  Rectal bleeding due to ischemic colitis. EXAM: CT ANGIOGRAPHY ABDOMEN AND PELVIS WITH CONTRAST AND WITHOUT CONTRAST TECHNIQUE: Multidetector CT imaging of the abdomen and pelvis was performed using the standard protocol during bolus administration of intravenous contrast. Multiplanar reconstructed images and MIPs were obtained and reviewed to evaluate the vascular anatomy. CONTRAST:  OMNIPAQUE IOHEXOL 350 MG/ML SOLN COMPARISON:  Chest radiograph 324 2020 FINDINGS: VASCULAR Aorta: Mild atherosclerotic disease in the abdominal aorta without aneurysm or dissection. Celiac: Patent without evidence of aneurysm, dissection, vasculitis or significant stenosis. SMA: Patent without evidence of aneurysm, dissection, vasculitis or significant stenosis. Renals: Both renal arteries are patent without evidence of aneurysm, dissection, vasculitis, fibromuscular dysplasia or significant stenosis. IMA: Patent without evidence of aneurysm, dissection, vasculitis or significant stenosis. Inflow: Patent without evidence of aneurysm, dissection, vasculitis or significant stenosis. Proximal Outflow: Proximal femoral arteries are patent bilaterally. Veins: Portal venous system is patent. The mesenteric veins are widely patent. Narrowing of the external iliac veins may be related to the urinary bladder distension. IVC and renal veins are patent. Review of the MIP images confirms the above findings. NON-VASCULAR Lower chest: Atelectasis or scarring in the lingula. No large pleural effusions. Probable  small hiatal hernia. Hepatobiliary: Lobulated cystic structure in the left hepatic lobe measuring 3.2 x 2.1 cm. Small  amount of perihepatic ascites. Gallbladder is mildly distended without inflammatory changes. Portal venous system is patent. No suspicious liver lesions. Pancreas: Low-density or cystic lesion in the distal pancreatic body / tail region. This is best seen on the coronal reformats, sequence 9, image 49. Lesion measures up to 1.7 cm. There is no significant pancreatic duct dilatation. Spleen: Normal in size without focal abnormality. Adrenals/Urinary Tract: Normal adrenal glands. Normal appearance of both kidneys without hydronephrosis. No suspicious renal lesions. Urinary bladder is distended. Stomach/Bowel: Wall thickening in sigmoid colon and descending colon including the splenic flexure. Pericolonic edema involving the descending colon. There appears to be mucosal enhancement involving the segments of colon with wall thickening. There is normal mucosal enhancement in the rectum containing a small amount of fluid. Normal appearance of the right colon and transverse colon. Appendix is not confidently identified but the cecum is slightly anteriorly located. Small hiatal hernia. Normal appearance of the stomach. Normal appearance of small bowel. Lymphatic: No significant lymph node enlargement in the abdomen or pelvis. Reproductive: Low-density structure involving the left ovary could represent a cystic structure measuring to 4.1 cm on sequence 9, image 57. This is indeterminate for a patient of this age. No gross abnormality to the uterus. Other: Small amount of free fluid in the pelvis. Small amount of free fluid in the lower abdominal quadrants. Musculoskeletal: No acute bone abnormality. IMPRESSION: VASCULAR 1. Minimal atherosclerotic disease in the abdomen and pelvis. Negative for an arterial aneurysm, dissection or emboli. 2. Main visceral arteries are widely patent. Mesenteric veins are patent. NON-VASCULAR 1. Wall thickening involving the splenic flexure, descending colon and sigmoid colon. Imaging findings  are suggestive for colitis. Etiology for the colitis is not clear based on the imaging findings. 2. Small amount of ascites in the abdomen and pelvis likely secondary to the inflammatory changes in the left colon. 3. **An incidental finding of potential clinical significance has been found. Indeterminate 1.7 cm low-density, probable cystic lesion involving the distal pancreatic body region. Recommend six-month follow up imaging with MRI. 4. **An incidental finding of potential clinical significance has been found. Enlarged left ovary and possible cystic structure. Recommend follow-up pelvic ultrasound to evaluate the left ovary. ** Electronically Signed   By: Richarda Overlie M.D.   On: 01/20/2019 16:20  [2 weeks]   Assessment: 83 y/o female presented with acute onset painless hematochezia. Attempted colonoscopy yesterday was incomplete. Procedure stopped due to noted ischemic colitis with evidence of infarction. Scope passed to 40cm (sigmoid colon). STAT CTA showed main visceral arteries widely patient, mesenteric veins patent but wall thickening of left colon c/w colitis.   Incidental CT findings: Indeterminate 1.7cm probable cystic lesion distal pancreatic body region, recommend 6 month f/u MRI.   Incidental CT findings: enlarged left ovary and possible cystic structure, recommend pelvic ultrasound. Ovaries reported normal at time of exploratory laparotomy.   Leukocytosis: improving.   Plan: 1. Follow up pending path.  2. Discussed with patient, we will contact her in 6 months to make arrangements for MRI pancreas. 3. She should discuss left ovary findings with her PCP to determine if u/s needed.   Leanna Battles. Dixon [facility]os [facility] [phone] 3/26/20208:42 AM     LOS: 2 days

## 2019-01-21 NOTE — Telephone Encounter (Signed)
Please NIC for MRI abdomen with pancreatic protocol in 6 MONTHS to follow up on indeterminate cystic pancreatic lesion seen on CTA.

## 2019-01-21 NOTE — Progress Notes (Signed)
*  PRELIMINARY RESULTS* Echocardiogram 2D Echocardiogram has been performed.  Stacey Drain 01/21/2019, 3:41 PM

## 2019-01-21 NOTE — Telephone Encounter (Signed)
ON RECALL  °

## 2019-01-22 ENCOUNTER — Telehealth: Payer: Self-pay | Admitting: Gastroenterology

## 2019-01-22 LAB — CBC
HCT: 38.2 % (ref 36.0–46.0)
Hemoglobin: 12.3 g/dL (ref 12.0–15.0)
MCH: 29.7 pg (ref 26.0–34.0)
MCHC: 32.2 g/dL (ref 30.0–36.0)
MCV: 92.3 fL (ref 80.0–100.0)
Platelets: 159 10*3/uL (ref 150–400)
RBC: 4.14 MIL/uL (ref 3.87–5.11)
RDW: 13.8 % (ref 11.5–15.5)
WBC: 12.1 10*3/uL — AB (ref 4.0–10.5)
nRBC: 0 % (ref 0.0–0.2)

## 2019-01-22 LAB — BASIC METABOLIC PANEL
Anion gap: 7 (ref 5–15)
BUN: 8 mg/dL (ref 8–23)
CO2: 27 mmol/L (ref 22–32)
Calcium: 8.3 mg/dL — ABNORMAL LOW (ref 8.9–10.3)
Chloride: 103 mmol/L (ref 98–111)
Creatinine, Ser: 0.66 mg/dL (ref 0.44–1.00)
GFR calc Af Amer: >60 mL/min (ref >60)
GFR calc non Af Amer: >60 mL/min (ref >60)
Glucose, Bld: 98 mg/dL (ref 70–99)
Potassium: 3.5 mmol/L (ref 3.5–5.1)
Sodium: 137 mmol/L (ref 135–145)

## 2019-01-22 LAB — GLUCOSE, CAPILLARY: Glucose-Capillary: 145 mg/dL — ABNORMAL HIGH (ref 70–99)

## 2019-01-22 LAB — MAGNESIUM: Magnesium: 1.9 mg/dL (ref 1.7–2.4)

## 2019-01-22 LAB — PHOSPHORUS: Phosphorus: 2.5 mg/dL (ref 2.5–4.6)

## 2019-01-22 NOTE — Progress Notes (Signed)
Subjective: Feeling well. BM this morning, no obvious blood. Denies abdominal pain, N/V. No GI complaints at this time.  Objective: Vital signs in last 24 hours: Temp:  [98.4 F (36.9 C)-99.2 F (37.3 C)] 98.7 F (37.1 C) (03/27 0549) Pulse Rate:  [77-90] 88 (03/27 0549) Resp:  [15-20] 16 (03/27 0549) BP: (118-133)/(70-82) 121/70 (03/27 0549) SpO2:  [94 %-97 %] 94 % (03/27 0549) Last BM Date: 01/20/19 General:   Alert and oriented, pleasant Eyes:  No icterus, sclera clear. Conjuctiva pink.  Mouth:  Without lesions, mucosa pink and moist.  Neck:  Supple, without thyromegaly or masses.  Heart:  S1, S2 present, no murmurs noted.  Lungs: Clear to auscultation bilaterally, without wheezing, rales, or rhonchi.  Abdomen:  Bowel sounds present, soft, non-tender, non-distended. No HSM or hernias noted. No rebound or guarding. No masses appreciated  Msk:  Symmetrical without gross deformities. Extremities:  Without clubbing or edema. Neurologic:  Alert and  oriented x4;  grossly normal neurologically. Skin:  Warm and dry, intact without significant lesions. Surgical incision mid-abdomen with dressing clean, dry, intact. Psych:  Alert and cooperative. Normal mood and affect.  Intake/Output from previous day: 03/26 0701 - 03/27 0700 In: 3642.1 [P.O.:1440; I.V.:2061.1; IV Piggyback:141] Out: -  Intake/Output this shift: No intake/output data recorded.  Lab Results: Recent Labs    01/20/19 0422 01/21/19 0457 01/22/19 0444  WBC 21.7* 15.6* 12.1*  HGB 14.5 12.1 12.3  HCT 43.2 36.8 38.2  PLT 195 147* 159   BMET Recent Labs    01/20/19 0422 01/21/19 0457 01/22/19 0444  NA 135 136 137  K 3.9 3.6 3.5  CL 105 104 103  CO2 23 24 27   GLUCOSE 151* 112* 98  BUN 13 10 8   CREATININE 0.75 0.67 0.66  CALCIUM 8.8* 8.1* 8.3*   LFT No results for input(s): PROT, ALBUMIN, AST, ALT, ALKPHOS, BILITOT, BILIDIR, IBILI in the last 72 hours. PT/INR No results for input(s): LABPROT, INR  in the last 72 hours. Hepatitis Panel No results for input(s): HEPBSAG, HCVAB, HEPAIGM, HEPBIGM in the last 72 hours.   Studies/Results: Ct Angio Abd/pel W/ And/or W/o  Result Date: 01/20/2019 CLINICAL DATA:  Rectal bleeding due to ischemic colitis. EXAM: CT ANGIOGRAPHY ABDOMEN AND PELVIS WITH CONTRAST AND WITHOUT CONTRAST TECHNIQUE: Multidetector CT imaging of the abdomen and pelvis was performed using the standard protocol during bolus administration of intravenous contrast. Multiplanar reconstructed images and MIPs were obtained and reviewed to evaluate the vascular anatomy. CONTRAST:  OMNIPAQUE IOHEXOL 350 MG/ML SOLN COMPARISON:  Chest radiograph 324 2020 FINDINGS: VASCULAR Aorta: Mild atherosclerotic disease in the abdominal aorta without aneurysm or dissection. Celiac: Patent without evidence of aneurysm, dissection, vasculitis or significant stenosis. SMA: Patent without evidence of aneurysm, dissection, vasculitis or significant stenosis. Renals: Both renal arteries are patent without evidence of aneurysm, dissection, vasculitis, fibromuscular dysplasia or significant stenosis. IMA: Patent without evidence of aneurysm, dissection, vasculitis or significant stenosis. Inflow: Patent without evidence of aneurysm, dissection, vasculitis or significant stenosis. Proximal Outflow: Proximal femoral arteries are patent bilaterally. Veins: Portal venous system is patent. The mesenteric veins are widely patent. Narrowing of the external iliac veins may be related to the urinary bladder distension. IVC and renal veins are patent. Review of the MIP images confirms the above findings. NON-VASCULAR Lower chest: Atelectasis or scarring in the lingula. No large pleural effusions. Probable small hiatal hernia. Hepatobiliary: Lobulated cystic structure in the left hepatic lobe measuring 3.2 x 2.1 cm. Small amount of  perihepatic ascites. Gallbladder is mildly distended without inflammatory changes. Portal venous  system is patent. No suspicious liver lesions. Pancreas: Low-density or cystic lesion in the distal pancreatic body / tail region. This is best seen on the coronal reformats, sequence 9, image 49. Lesion measures up to 1.7 cm. There is no significant pancreatic duct dilatation. Spleen: Normal in size without focal abnormality. Adrenals/Urinary Tract: Normal adrenal glands. Normal appearance of both kidneys without hydronephrosis. No suspicious renal lesions. Urinary bladder is distended. Stomach/Bowel: Wall thickening in sigmoid colon and descending colon including the splenic flexure. Pericolonic edema involving the descending colon. There appears to be mucosal enhancement involving the segments of colon with wall thickening. There is normal mucosal enhancement in the rectum containing a small amount of fluid. Normal appearance of the right colon and transverse colon. Appendix is not confidently identified but the cecum is slightly anteriorly located. Small hiatal hernia. Normal appearance of the stomach. Normal appearance of small bowel. Lymphatic: No significant lymph node enlargement in the abdomen or pelvis. Reproductive: Low-density structure involving the left ovary could represent a cystic structure measuring to 4.1 cm on sequence 9, image 57. This is indeterminate for a patient of this age. No gross abnormality to the uterus. Other: Small amount of free fluid in the pelvis. Small amount of free fluid in the lower abdominal quadrants. Musculoskeletal: No acute bone abnormality. IMPRESSION: VASCULAR 1. Minimal atherosclerotic disease in the abdomen and pelvis. Negative for an arterial aneurysm, dissection or emboli. 2. Main visceral arteries are widely patent. Mesenteric veins are patent. NON-VASCULAR 1. Wall thickening involving the splenic flexure, descending colon and sigmoid colon. Imaging findings are suggestive for colitis. Etiology for the colitis is not clear based on the imaging findings. 2. Small  amount of ascites in the abdomen and pelvis likely secondary to the inflammatory changes in the left colon. 3. **An incidental finding of potential clinical significance has been found. Indeterminate 1.7 cm low-density, probable cystic lesion involving the distal pancreatic body region. Recommend six-month follow up imaging with MRI. 4. **An incidental finding of potential clinical significance has been found. Enlarged left ovary and possible cystic structure. Recommend follow-up pelvic ultrasound to evaluate the left ovary. ** Electronically Signed   By: Richarda Overlie M.D.   On: 01/20/2019 16:20    Assessment: 83 y/o female presented with acute onset painless hematochezia. Attempted colonoscopy yesterday was incomplete. Procedure stopped due to noted ischemic colitis with evidence of infarction. Scope passed to 40cm (sigmoid colon). STAT CTA showed main visceral arteries widely patient, mesenteric veins patent but wall thickening of left colon c/w colitis. However, it appeared that the left colic artery was not visualized but prominent IMA feeding the left colon.  Incidental CT findings: Indeterminate 1.7cm probable cystic lesion distal pancreatic body region, recommend 6 month f/u MRI.   Incidental CT findings: enlarged left ovary and possible cystic structure, recommend pelvic ultrasound.   Patient taken for exploratory laparotomy which found from the splenic flexure down to the sigmoid colon there is erythematous and thickened colon wall but no evidence of full-thickness necrosis or cyanosis.  Incidentally ovaries appeared normal despite incidental CT findings. The abdominal cavity was copiously irrigated and no partial colectomy undertaken.  Leukocytosis continues to improve: 12.1 today (from 21.7 peak 2 days ago).     Plan: 1. Continued post-op care directed by surgeon 2. Monitor for recurrent GI bleed 3. Monitor H/H 4. Supportive measures 5. MRI pancreas has been planned by our office for  6 months  Thank you for allowing us to participate in the care of [facility]  Wynne DustEric Tevis Dunavan, DNP, AGNP-C Adult & Gerontological Nurse Practitioner [facility]     LOS: 3 days    01/22/2019, 7:47 AM

## 2019-01-22 NOTE — Telephone Encounter (Signed)
FOLLOW UP IN 3 MOS WITH DR. FIELDS, DX: ISCHEMIC/NECROTIC COLON.

## 2019-01-22 NOTE — Progress Notes (Signed)
2 Days Post-Op  Subjective: Patient has no abdominal pain.  She has had multiple bowel movements.  She feels better than she did preoperatively.  Objective: Vital signs in last 24 hours: Temp:  [98.4 F (36.9 C)-99.2 F (37.3 C)] 98.7 F (37.1 C) (03/27 0549) Pulse Rate:  [77-90] 88 (03/27 0549) Resp:  [15-20] 16 (03/27 0549) BP: (118-133)/(70-82) 121/70 (03/27 0549) SpO2:  [94 %-97 %] 94 % (03/27 0549) Last BM Date: 01/20/19  Intake/Output from previous day: 03/26 0701 - 03/27 0700 In: 3642.1 [P.O.:1440; I.V.:2061.1; IV Piggyback:141] Out: -  Intake/Output this shift: Total I/O In: 240 [P.O.:240] Out: -   General appearance: alert, cooperative and no distress Resp: clear to auscultation bilaterally Cardio: regular rate and rhythm, S1, S2 normal, no murmur, click, rub or gallop GI: Soft, bowel sounds active.  Incision healing well.  Nondistended.  Lab Results:  Recent Labs    01/21/19 0457 01/22/19 0444  WBC 15.6* 12.1*  HGB 12.1 12.3  HCT 36.8 38.2  PLT 147* 159   BMET Recent Labs    01/21/19 0457 01/22/19 0444  NA 136 137  K 3.6 3.5  CL 104 103  CO2 24 27  GLUCOSE 112* 98  BUN 10 8  CREATININE 0.67 0.66  CALCIUM 8.1* 8.3*   PT/INR No results for input(s): LABPROT, INR in the last 72 hours.  Studies/Results: Ct Angio Abd/pel W/ And/or W/o  Result Date: 01/20/2019 CLINICAL DATA:  Rectal bleeding due to ischemic colitis. EXAM: CT ANGIOGRAPHY ABDOMEN AND PELVIS WITH CONTRAST AND WITHOUT CONTRAST TECHNIQUE: Multidetector CT imaging of the abdomen and pelvis was performed using the standard protocol during bolus administration of intravenous contrast. Multiplanar reconstructed images and MIPs were obtained and reviewed to evaluate the vascular anatomy. CONTRAST:  OMNIPAQUE IOHEXOL 350 MG/ML SOLN COMPARISON:  Chest radiograph 324 2020 FINDINGS: VASCULAR Aorta: Mild atherosclerotic disease in the abdominal aorta without aneurysm or dissection. Celiac:  Patent without evidence of aneurysm, dissection, vasculitis or significant stenosis. SMA: Patent without evidence of aneurysm, dissection, vasculitis or significant stenosis. Renals: Both renal arteries are patent without evidence of aneurysm, dissection, vasculitis, fibromuscular dysplasia or significant stenosis. IMA: Patent without evidence of aneurysm, dissection, vasculitis or significant stenosis. Inflow: Patent without evidence of aneurysm, dissection, vasculitis or significant stenosis. Proximal Outflow: Proximal femoral arteries are patent bilaterally. Veins: Portal venous system is patent. The mesenteric veins are widely patent. Narrowing of the external iliac veins may be related to the urinary bladder distension. IVC and renal veins are patent. Review of the MIP images confirms the above findings. NON-VASCULAR Lower chest: Atelectasis or scarring in the lingula. No large pleural effusions. Probable small hiatal hernia. Hepatobiliary: Lobulated cystic structure in the left hepatic lobe measuring 3.2 x 2.1 cm. Small amount of perihepatic ascites. Gallbladder is mildly distended without inflammatory changes. Portal venous system is patent. No suspicious liver lesions. Pancreas: Low-density or cystic lesion in the distal pancreatic body / tail region. This is best seen on the coronal reformats, sequence 9, image 49. Lesion measures up to 1.7 cm. There is no significant pancreatic duct dilatation. Spleen: Normal in size without focal abnormality. Adrenals/Urinary Tract: Normal adrenal glands. Normal appearance of both kidneys without hydronephrosis. No suspicious renal lesions. Urinary bladder is distended. Stomach/Bowel: Wall thickening in sigmoid colon and descending colon including the splenic flexure. Pericolonic edema involving the descending colon. There appears to be mucosal enhancement involving the segments of colon with wall thickening. There is normal mucosal enhancement in the rectum containing a  small amount of fluid. Normal appearance of the right colon and transverse colon. Appendix is not confidently identified but the cecum is slightly anteriorly located. Small hiatal hernia. Normal appearance of the stomach. Normal appearance of small bowel. Lymphatic: No significant lymph node enlargement in the abdomen or pelvis. Reproductive: Low-density structure involving the left ovary could represent a cystic structure measuring to 4.1 cm on sequence 9, image 57. This is indeterminate for a patient of this age. No gross abnormality to the uterus. Other: Small amount of free fluid in the pelvis. Small amount of free fluid in the lower abdominal quadrants. Musculoskeletal: No acute bone abnormality. IMPRESSION: VASCULAR 1. Minimal atherosclerotic disease in the abdomen and pelvis. Negative for an arterial aneurysm, dissection or emboli. 2. Main visceral arteries are widely patent. Mesenteric veins are patent. NON-VASCULAR 1. Wall thickening involving the splenic flexure, descending colon and sigmoid colon. Imaging findings are suggestive for colitis. Etiology for the colitis is not clear based on the imaging findings. 2. Small amount of ascites in the abdomen and pelvis likely secondary to the inflammatory changes in the left colon. 3. **An incidental finding of potential clinical significance has been found. Indeterminate 1.7 cm low-density, probable cystic lesion involving the distal pancreatic body region. Recommend six-month follow up imaging with MRI. 4. **An incidental finding of potential clinical significance has been found. Enlarged left ovary and possible cystic structure. Recommend follow-up pelvic ultrasound to evaluate the left ovary. ** Electronically Signed   By: Richarda Overlie M.D.   On: 01/20/2019 16:20    Anti-infectives: Anti-infectives (From admission, onward)   Start     Dose/Rate Route Frequency Ordered Stop   01/20/19 1715  piperacillin-tazobactam (ZOSYN) IVPB 3.375 g     3.375 g 12.5  mL/hr over 240 Minutes Intravenous Every 8 hours 01/20/19 1713     01/20/19 1300  piperacillin-tazobactam (ZOSYN) IVPB 3.375 g     3.375 g 100 mL/hr over 30 Minutes Intravenous  Once 01/20/19 1250 01/20/19 1454   01/20/19 1300  piperacillin-tazobactam (ZOSYN) IVPB     over 30 Minutes  Continuous PRN 01/20/19 1345 01/20/19 1300      Assessment/Plan: s/p Procedure(s): EXPLORATORY LAPAROTOMY Impression: Stable on postoperative day 2.  Echo negative for mural thrombus.  Suspect patient had transient ischemic event to the left colon.  She has been recovering remarkably well. Plan: Will advance to soft diet.  Continue IV Zosyn.  Anticipate discharge in next 24 to 48 hours.  LOS: 3 days    Franky Macho 01/22/2019

## 2019-01-22 NOTE — Care Management Important Message (Signed)
Important Message  Patient Details  Name: Susan Mercado MRN: [id number] Date of Birth: November 23, 1935   Medicare Important Message Given:  Yes    Corey Harold 01/22/2019, 4:41 PM

## 2019-01-22 NOTE — Progress Notes (Signed)
Ate over 50% of soft diet for lunch today and denies any nausea or pain.  Abdomen soft and dressing dry and intact

## 2019-01-23 LAB — CBC
HCT: 35.5 % — ABNORMAL LOW (ref 36.0–46.0)
Hemoglobin: 11.8 g/dL — ABNORMAL LOW (ref 12.0–15.0)
MCH: 30.5 pg (ref 26.0–34.0)
MCHC: 33.2 g/dL (ref 30.0–36.0)
MCV: 91.7 fL (ref 80.0–100.0)
NRBC: 0 % (ref 0.0–0.2)
Platelets: 187 10*3/uL (ref 150–400)
RBC: 3.87 MIL/uL (ref 3.87–5.11)
RDW: 13.8 % (ref 11.5–15.5)
WBC: 12.6 10*3/uL — ABNORMAL HIGH (ref 4.0–10.5)

## 2019-01-23 LAB — BASIC METABOLIC PANEL
Anion gap: 9 (ref 5–15)
BUN: 8 mg/dL (ref 8–23)
CALCIUM: 8.4 mg/dL — AB (ref 8.9–10.3)
CO2: 26 mmol/L (ref 22–32)
Chloride: 102 mmol/L (ref 98–111)
Creatinine, Ser: 0.61 mg/dL (ref 0.44–1.00)
GFR calc non Af Amer: >60 mL/min (ref >60)
Glucose, Bld: 105 mg/dL — ABNORMAL HIGH (ref 70–99)
Potassium: 3.1 mmol/L — ABNORMAL LOW (ref 3.5–5.1)
Sodium: 137 mmol/L (ref 135–145)

## 2019-01-23 MED ORDER — POTASSIUM CHLORIDE ER 20 MEQ PO TBCR: 20.0000 meq | EXTENDED_RELEASE_TABLET | Freq: Two times a day (BID) | ORAL | 0 refills | Status: AC

## 2019-01-23 MED ORDER — AMOXICILLIN-POT CLAVULANATE 875-125 MG PO TABS: 1.0000 | ORAL_TABLET | Freq: Two times a day (BID) | ORAL | 0 refills | Status: AC

## 2019-01-23 NOTE — Discharge Summary (Signed)
Physician Discharge Summary  Patient ID: Susan Mercado MRN: [id number] DOB/AGE: [birth year] 83 y.o.  Admit date: 01/19/2019 Discharge date: 01/23/2019  Admission Diagnoses: Blood per rectum  Discharge Diagnoses: Acute ischemic colitis Principal Problem:   Acute ischemic colitis [facility]) Active Problems:   Painless rectal bleeding   Hypertension   Hypothyroidism   Leukocytosis   Colitis, acute   Pancreatic lesion   Discharged Condition: good  Hospital Course: Patient is an 83 year old white female who was transferred to [facility] for GI consultation for blood per rectum.  She underwent a sigmoidoscopy on 01/20/2019 and was found to have ischemic mucosa.  A stat CT scan of the abdomen with angiography revealed diffuse thickening descending colon with pericolonic fluid.  The left colic artery was not visualized and the descending colon seemed to be dependent on the inferior mesenteric artery and marginal artery.  No significant atherosclerotic disease was seen.  The patient was taken emergently to the operating room and underwent exploratory laparotomy.  She was found to have thickened erythematous descending colon, but no transmural necrosis.  She tolerated the procedure well.  Her postoperative course has been unremarkable.  Her diet was advanced out difficulty once her bowel function returned.  She was continued on Zosyn in the perioperative period.  2D echo of the heart was negative for mural thrombus.  She has mild hypokalemia.  Intraoperative cultures have been negative.  The patient is being discharged home on 01/23/2019 in good and improving condition.  Treatments: surgery: Sigmoidoscopy with biopsies on 01/20/2019 by Dr. Darrick Penna Exploratory laparotomy on 01/20/2019 by Dr. Lovell Sheehan  Discharge Exam: Blood pressure (!) 141/75, pulse 80, temperature 98.8 F (37.1 C), temperature source Oral, resp. rate 18, height 5' 5.5" (1.664 m), weight 69.1 kg, SpO2 96 %. General appearance: alert,  cooperative and no distress Resp: clear to auscultation bilaterally Cardio: regular rate and rhythm, S1, S2 normal, no murmur, click, rub or gallop GI: Soft, incision healing well.  Disposition: Discharge disposition: 01-Home or Self Care       Discharge Instructions    Diet - low sodium heart healthy   Complete by:  As directed    Increase activity slowly   Complete by:  As directed      Allergies as of 01/23/2019      Reactions   Lisinopril Swelling   Shellfish Allergy Nausea And Vomiting      Medication List    TAKE these medications   amLODipine 5 MG tablet Commonly known as:  NORVASC Take 5 mg by mouth daily.   amoxicillin-clavulanate 875-125 MG tablet Commonly known as:  Augmentin Take 1 tablet by mouth 2 (two) times daily.   levothyroxine 75 MCG tablet Commonly known as:  SYNTHROID, LEVOTHROID Take 75 mcg by mouth daily.   metoprolol succinate 50 MG 24 hr tablet Commonly known as:  TOPROL-XL Take 1.5 tablets by mouth 2 (two) times daily.   Potassium Chloride ER 20 MEQ Tbcr Take 20 mEq by mouth 2 (two) times daily.      Follow-up Information    Franky Macho, MD. Go on 02/02/2019.   Specialty:  General Surgery Why:  Come to my office at 9:30am on Tuesday 02/02/19 Contact information: [id number] [place] [address] [phone]           Signed: Franky Macho 01/23/2019, 8:57 AM

## 2019-01-23 NOTE — Discharge Instructions (Signed)
Exploratory Laparotomy, Adult, Care After °This sheet gives you information about how to care for yourself after your procedure. Your health care provider may also give you more specific instructions. If you have problems or questions, contact your health care provider. °What can I expect after the procedure? °After the procedure, it is common to have: °· Abdominal soreness. °· Fatigue. °· A sore throat from the tube in your throat. °· A lack of appetite. °Follow these instructions at home: °Medicines °· Take over-the-counter and prescription medicines only as told by your health care provider. °· If you were prescribed an antibiotic medicine, take it as told by your health care provider. Do not stop taking the antibiotic even if you start to feel better. °· Do not drive or operate heavy machinery while taking pain medicine. °· If you are taking prescription pain medicine, take actions to prevent or treat constipation. Your health care provider may recommend that you: °? Drink enough fluid to keep your urine pale yellow. °? Eat foods that are high in fiber, such as fresh fruits and vegetables, whole grains, and beans. °? Limit foods that are high in fat and processed sugars, such as fried or sweet foods. °? Take an over-the-counter or prescription medicine for constipation. Undergoing surgery and taking pain medicines can make constipation worse. °Incision care ° °· Follow instructions from your health care provider about how to take care of your incision. Make sure you: °? Wash your hands with soap and water before you change your bandage (dressing). If soap and water are not available, use hand sanitizer. °? Change your dressing as told by your health care provider. °? Leave stitches (sutures), skin glue, or adhesive strips in place. These skin closures may need to stay in place for 2 weeks or longer. If adhesive strip edges start to loosen and curl up, you may trim the loose edges. Do not remove adhesive strips  completely unless your health care provider tells you to do that. °· If you were sent home with a drain, follow instructions from your health care provider about how to care for it. °· Check your incision area every day for signs of infection. Check for: °? Redness, swelling, or pain. °? Fluid or blood. °? Warmth. °? Pus or a bad smell. °Activity ° °· Rest as told by your health care provider. °? Avoid sitting for a long time without moving. Get up to take short walks every 1-2 hours. This is important to improve blood flow and breathing. Ask for help if you feel weak or unsteady. °· Do not lift anything that is heavier than 5 lb (2.2 kg), or the limit that your health care provider tells you, until he or she says that it is safe. °· Ask your health care provider when you can start to do your usual activities again, such as driving, going back to work, and having sex. °Eating and drinking °· You may eat what you usually eat. Include lots of whole grains, fruits, and vegetables in your diet. This will help to prevent constipation. °· Drink enough fluid to keep your urine pale yellow. °Bathing °· Keep your incision clean and dry. Clean it as often as told by your health care provider: °? Gently wash the incision with soap and water. °? Rinse the incision with water to remove all soap. °? Pat the incision dry with a clean towel. Do not rub the incision. °· You may take showers after 48 hours. °· Do not take baths,   swim, or use a hot tub until your health care provider says it is okay to do so. °General instructions °· Do not use any products that contain nicotine or tobacco, such as cigarettes and e-cigarettes. These can delay healing after surgery. If you need help quitting, ask your health care provider. °· Wear compression stockings as told by your health care provider. These stockings help to prevent blood clots and reduce swelling in your legs. °· Keep all follow-up visits as told by your health care provider.  This is important. °Contact a health care provider if: °· You have a fever. °· You have chills. °· Your pain medicine is not helping. °· You have constipation or diarrhea. °· You have nausea or vomiting. °· You have drainage, redness, swelling, or pain at your incision site. °Get help right away if: °· Your pain is getting worse. °· You have not had a bowel movement for more than 3 days. °· You have ongoing (persistent) vomiting. °· The edges of your incision open up. °· You have warmth, tenderness, and swelling in your calf. °· You have trouble breathing. °· You have chest pain. °These symptoms may represent a serious problem that is an emergency. Do not wait to see if the symptoms will go away. Get medical help right away. Call your local emergency services (911 in the [place]). Do not drive yourself to the hospital. °Summary °· Abdominal soreness is common after exploratory laparotomy. Take over-the-counter and prescription pain medicines only as told by your health care provider. °· Follow instructions from your health care provider about how to take care of your incision. Do not take baths, swim, or use a hot tub until your health care provider says it is okay to do so. °· Watch for signs and symptoms of infection after surgery, including fever, chills, drainage from your incision, and worsening abdominal pain. °This information is not intended to replace advice given to you by your health care provider. Make sure you discuss any questions you have with your health care provider. °Document Released: 05/28/2004 Document Revised: 10/24/2017 Document Reviewed: 10/24/2017 °Elsevier Interactive Patient Education © 2019 [employer] ° °

## 2019-01-25 ENCOUNTER — Encounter: Payer: Self-pay | Admitting: Gastroenterology

## 2019-01-25 LAB — AEROBIC/ANAEROBIC CULTURE W GRAM STAIN (SURGICAL/DEEP WOUND): Culture: NO GROWTH

## 2019-01-25 NOTE — Telephone Encounter (Signed)
PATIENT SCHEDULED AND LETTER SENT  °

## 2019-02-02 ENCOUNTER — Encounter: Payer: Self-pay | Admitting: General Surgery

## 2019-02-02 ENCOUNTER — Other Ambulatory Visit: Payer: Self-pay

## 2019-02-02 ENCOUNTER — Ambulatory Visit (INDEPENDENT_AMBULATORY_CARE_PROVIDER_SITE_OTHER): Payer: Self-pay | Admitting: General Surgery

## 2019-02-02 VITALS — BP 107/71 | HR 105 | Temp 97.8°F | Resp 18 | Wt 152.0 lb

## 2019-02-02 DIAGNOSIS — Z09 Encounter for follow-up examination after completed treatment for conditions other than malignant neoplasm: Secondary | ICD-10-CM

## 2019-02-02 NOTE — Progress Notes (Signed)
Subjective:     Susan Mercado  Here for postoperative visit.  Patient doing well.  Has no complaints.  Her bloody bowel movements have resolved.  She has no diarrhea.  She has no fever or chills.  She has finished her antibiotic course. Objective:    BP 107/71 (BP Location: Left Arm, Patient Position: Sitting, Cuff Size: Normal)   Pulse (!) 105   Temp 97.8 F (36.6 C) (Temporal)   Resp 18   Wt 152 lb (68.9 kg)   BMI 24.91 kg/m   General:  alert, cooperative and no distress  Abdomen soft, incision healing well.  Staples removed.     Assessment:    Doing well postoperatively.    Plan:   Increase activity as able.  Follow-up here as needed.

## 2019-04-22 ENCOUNTER — Ambulatory Visit (INDEPENDENT_AMBULATORY_CARE_PROVIDER_SITE_OTHER): Payer: Medicare Other | Admitting: Gastroenterology

## 2019-04-22 ENCOUNTER — Other Ambulatory Visit: Payer: Self-pay

## 2019-04-22 ENCOUNTER — Encounter: Payer: Self-pay | Admitting: Gastroenterology

## 2019-04-22 DIAGNOSIS — Z8719 Personal history of other diseases of the digestive system: Secondary | ICD-10-CM

## 2019-04-22 NOTE — Patient Instructions (Signed)
DRINK WATER TO KEEP YOUR URINE LIGHT YELLOW.  FOLLOW A HIGH FIBER DIET.  AVOID ITEMS THAT CAUSE BLOATING & GAS. SEE INFO BELOW.  PLEASE CALL IF YOU CHANGE YOUR MINDS ABOUT HAVING A COLONOSCOPY TO REDUCE YOUR RISK FOR GETTING COLON CANCER.  FOLLOW UP IN THE OFFICE WILL BE SCHEDULED IF NEEDED.   PLEASE CALL WITH QUESTIONS OR CONCERNS.   High-Fiber Diet A high-fiber diet changes your normal diet to include more whole grains, legumes, fruits, and vegetables. Changes in the diet involve replacing refined carbohydrates with unrefined foods. The calorie level of the diet is essentially unchanged. The Dietary Reference Intake (recommended amount) for adult males is 38 grams per day. For adult females, it is 25 grams per day. Pregnant and lactating women should consume 28 grams of fiber per day. Fiber is the intact part of a plant that is not broken down during digestion. Functional fiber is fiber that has been isolated from the plant to provide a beneficial effect in the body.  PURPOSE  Increase stool bulk.   Ease and regulate bowel movements.   Lower cholesterol.   REDUCE RISK OF COLON CANCER  INDICATIONS THAT YOU NEED MORE FIBER  Constipation and hemorrhoids.   Uncomplicated diverticulosis (intestine condition) and irritable bowel syndrome.   Weight management.   As a protective measure against hardening of the arteries (atherosclerosis), diabetes, and cancer.   GUIDELINES FOR INCREASING FIBER IN THE DIET  Start adding fiber to the diet slowly. A gradual increase of about 5 more grams (2 slices of whole-wheat bread, 2 servings of most fruits or vegetables, or 1 bowl of high-fiber cereal) per day is best. Too rapid an increase in fiber may result in constipation, flatulence, and bloating.   Drink enough water and fluids to keep your urine clear or pale yellow. Water, juice, or caffeine-free drinks are recommended. Not drinking enough fluid may cause constipation.   Eat a variety of  high-fiber foods rather than one type of fiber.   Try to increase your intake of fiber through using high-fiber foods rather than fiber pills or supplements that contain small amounts of fiber.   The goal is to change the types of food eaten. Do not supplement your present diet with high-fiber foods, but replace foods in your present diet.    INCLUDE A VARIETY OF FIBER SOURCES  Replace refined and processed grains with whole grains, canned fruits with fresh fruits, and incorporate other fiber sources. White rice, white breads, and most bakery goods contain little or no fiber.   Brown whole-grain rice, buckwheat oats, and many fruits and vegetables are all good sources of fiber. These include: broccoli, Brussels sprouts, cabbage, cauliflower, beets, sweet potatoes, white potatoes (skin on), carrots, tomatoes, eggplant, squash, berries, fresh fruits, and dried fruits.   Cereals appear to be the richest source of fiber. Cereal fiber is found in whole grains and bran. Bran is the fiber-rich outer coat of cereal grain, which is largely removed in refining. In whole-grain cereals, the bran remains. In breakfast cereals, the largest amount of fiber is found in those with "bran" in their names. The fiber content is sometimes indicated on the label.   You may need to include additional fruits and vegetables each day.   In baking, for 1 cup white flour, you may use the following substitutions:   1 cup whole-wheat flour minus 2 tablespoons.   1/2 cup white flour plus 1/2 cup whole-wheat flour.

## 2019-04-22 NOTE — Progress Notes (Signed)
Subjective:    Patient ID: Susan Mercado, female    DOB: 07/17/1936, 83 y.o.   MRN: [id number]   Glenda Chroman, MD  HPI Been back to walking and doing everything she was doing before. BMs: PRETTY MUCH EVERY DAY, RARE QOD. NO BLOOD IN STOOLS. OCCASIONAL TWINGE LIKE A PIN PRICK IN LEFT ABDOMEN. APPETITE: BACK TO [facility].  MAR 2020: 152 LBS AND SHE IS OK WITH HER WEIGHT.   PT DENIES FEVER, CHILLS, HEMATOCHEZIA, HEMATEMESIS, nausea, vomiting, melena, diarrhea,CHEST PAIN, SHORTNESS OF BREATH, CHANGE IN BOWEL IN HABITS, abdominal pain, problems swallowing, problems with sedation, OR heartburn or indigestion.   Past Medical History:  Diagnosis Date  . Hypertension   . Hypothyroidism    Past Surgical History:  Procedure Laterality Date  . BIOPSY  01/20/2019   Procedure: BIOPSY;  Surgeon: Danie Binder, MD;  Location: [facility];  Service: Endoscopy;;  sigmoid   . COLECTOMY WITH COLOSTOMY CREATION/HARTMANN PROCEDURE N/A 01/20/2019   Procedure: EXPLORATORY LAPAROTOMY;  Surgeon: Aviva Signs, MD;  Location: [facility];  Service: General;  Laterality: N/A;  . SHOULDER SURGERY  1990   bil shoulder spurs removed  . SHOULDER SURGERY  1992   spurs  . SIGMOIDOSCOPY  01/20/2019   Procedure: SIGMOIDOSCOPY;  Surgeon: Danie Binder, MD;  Location: [facility];  Service: Endoscopy;;    Allergies  Allergen Reactions  . Lisinopril Swelling  . Shellfish Allergy Nausea And Vomiting    Current Outpatient Medications  Medication Sig    . NORVASC) 5 MG tablet Take 5 mg by mouth daily.    Marland Kitchen LEVOTHROID 75 MCG tablet Take 75 mcg by mouth daily.    . TOPROL-XL 50 MG 24 hr tablet Take 1.5 tablets by mouth 2 (two) times daily.    .      .       Review of Systems PER HPI OTHERWISE ALL SYSTEMS ARE NEGATIVE.    Objective:   Physical Exam Vitals signs reviewed.  Constitutional:      General: She is not in acute distress.    Appearance: She is well-developed.  HENT:     Head: Normocephalic and  atraumatic.     Mouth/Throat:     Pharynx: No oropharyngeal exudate.  Eyes:     General: No scleral icterus.    Pupils: Pupils are equal, round, and reactive to light.  Neck:     Musculoskeletal: Normal range of motion and neck supple.  Cardiovascular:     Rate and Rhythm: Normal rate and regular rhythm.     Heart sounds: Normal heart sounds.  Pulmonary:     Effort: Pulmonary effort is normal. No respiratory distress.     Breath sounds: Normal breath sounds.  Abdominal:     General: Bowel sounds are normal. There is no distension.     Palpations: Abdomen is soft.     Tenderness: There is no abdominal tenderness.     Comments: INCISION WELL HEALED  Musculoskeletal:     Right lower leg: No edema.     Left lower leg: No edema.  Lymphadenopathy:     Cervical: No cervical adenopathy.  Skin:    Comments: Hyperpigmented macular lesions on lower extremities  Neurological:     Mental Status: She is alert and oriented to person, place, and time.  Psychiatric:        Mood and Affect: Mood normal.     Comments: NORMAL AFFECT       Assessment &  Plan:

## 2019-04-22 NOTE — Assessment & Plan Note (Signed)
SYMPTOMS CONTROLLED/RESOLVED.  DRINK WATER TO KEEP YOUR URINE LIGHT YELLOW. FOLLOW A HIGH FIBER DIET.  AVOID ITEMS THAT CAUSE BLOATING & GAS.  PLEASE CALL IF YOU CHANGE YOUR MINDS ABOUT HAVING A COLONOSCOPY TO REDUCE YOUR RISK FOR GETTING COLON CANCER. FOLLOW UP IN THE OFFICE WILL BE SCHEDULED IF NEEDED.  PLEASE CALL WITH QUESTIONS OR CONCERNS.

## 2019-04-26 NOTE — Progress Notes (Signed)
cc'd to pcp 

## 2019-06-30 ENCOUNTER — Telehealth: Payer: Self-pay | Admitting: Gastroenterology

## 2019-06-30 NOTE — Telephone Encounter (Signed)
Recall sent 

## 2019-06-30 NOTE — Telephone Encounter (Signed)
Recall for mri abdomen pancreatic protocol

## 2019-09-21 DIAGNOSIS — Z1339 Encounter for screening examination for other mental health and behavioral disorders: Secondary | ICD-10-CM | POA: Diagnosis not present

## 2019-09-21 DIAGNOSIS — E039 Hypothyroidism, unspecified: Secondary | ICD-10-CM | POA: Diagnosis not present

## 2019-09-21 DIAGNOSIS — Z7189 Other specified counseling: Secondary | ICD-10-CM | POA: Diagnosis not present

## 2019-09-21 DIAGNOSIS — Z299 Encounter for prophylactic measures, unspecified: Secondary | ICD-10-CM | POA: Diagnosis not present

## 2019-09-21 DIAGNOSIS — R5383 Other fatigue: Secondary | ICD-10-CM | POA: Diagnosis not present

## 2019-09-21 DIAGNOSIS — K559 Vascular disorder of intestine, unspecified: Secondary | ICD-10-CM | POA: Diagnosis not present

## 2019-09-21 DIAGNOSIS — I10 Essential (primary) hypertension: Secondary | ICD-10-CM | POA: Diagnosis not present

## 2019-09-21 DIAGNOSIS — Z6824 Body mass index (BMI) 24.0-24.9, adult: Secondary | ICD-10-CM | POA: Diagnosis not present

## 2019-09-21 DIAGNOSIS — Z1331 Encounter for screening for depression: Secondary | ICD-10-CM | POA: Diagnosis not present

## 2019-09-21 DIAGNOSIS — Z0000 Encounter for general adult medical examination without abnormal findings: Secondary | ICD-10-CM | POA: Diagnosis not present

## 2019-09-28 DIAGNOSIS — E039 Hypothyroidism, unspecified: Secondary | ICD-10-CM | POA: Diagnosis not present

## 2019-09-28 DIAGNOSIS — R5383 Other fatigue: Secondary | ICD-10-CM | POA: Diagnosis not present

## 2019-09-28 DIAGNOSIS — E7800 Pure hypercholesterolemia, unspecified: Secondary | ICD-10-CM | POA: Diagnosis not present

## 2019-09-28 DIAGNOSIS — Z79899 Other long term (current) drug therapy: Secondary | ICD-10-CM | POA: Diagnosis not present

## 2019-11-10 IMAGING — CR PORTABLE CHEST - 1 VIEW
1 series · 1 of 1 positions shown · non-contrast
Comparison: None

CLINICAL DATA: Leukocytosis

EXAM:
PORTABLE CHEST 1 VIEW

[portable]
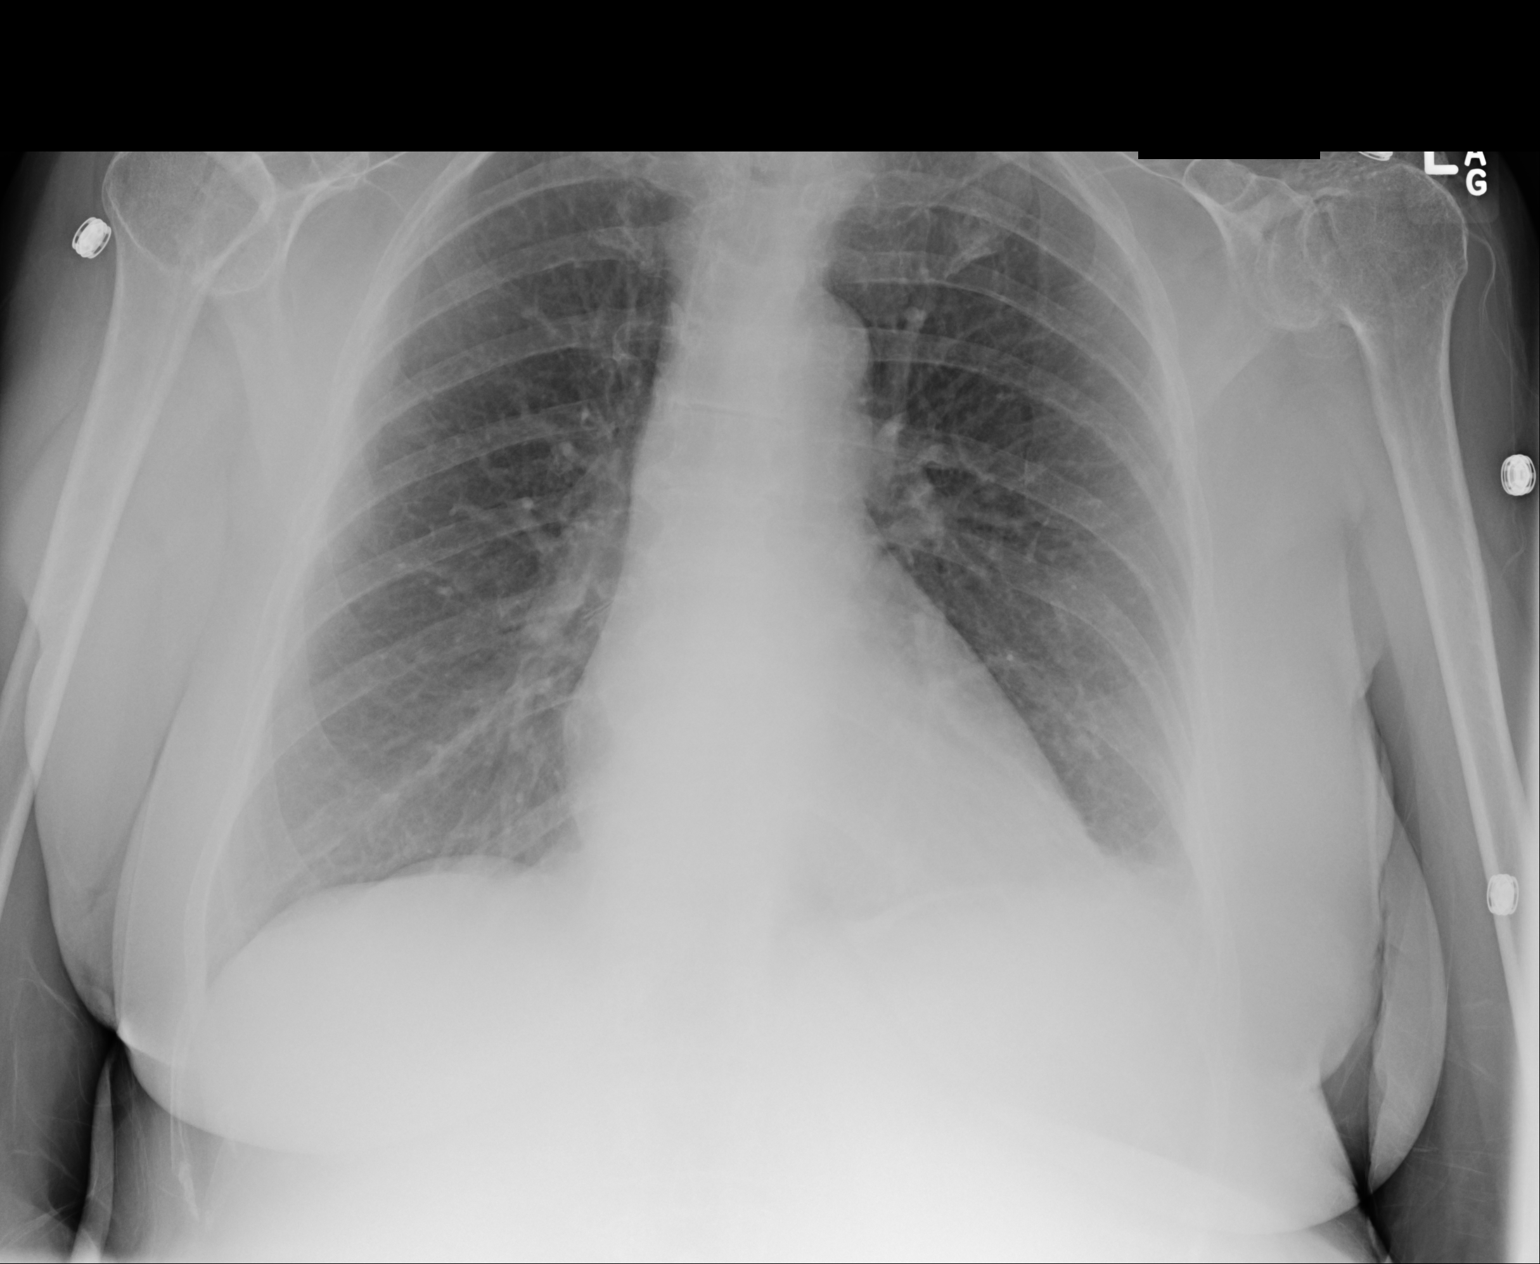

[1 of 1 positions shown; findings below may reference images not displayed]

FINDINGS: Cardiac shadows within normal limits. The lungs are well aerated
bilaterally. Minimal left basilar atelectasis is seen. No focal
confluent infiltrate is noted. No bony abnormality is seen.
IMPRESSION: Minimal left basilar atelectasis.

## 2019-11-11 IMAGING — CT CT ANGIOGRAPHY ABDOMEN AND PELVIS WITH CONTRAST AND WITHOUT CONT
3 of 9 series · 10 of 46 positions shown, 16 images · IV contrast (omnipaque)
Comparison: Chest radiograph [DATE]

CLINICAL DATA: Rectal bleeding due to ischemic colitis.

EXAM:
CT ANGIOGRAPHY ABDOMEN AND PELVIS WITH CONTRAST AND WITHOUT CONTRAST
TECHNIQUE: Multidetector CT imaging of the abdomen and pelvis was performed
using the standard protocol during bolus administration of
intravenous contrast. Multiplanar reconstructed images and MIPs were
obtained and reviewed to evaluate the vascular anatomy.
CONTRAST:  100mL OMNIPAQUE IOHEXOL 350 MG/ML SOLN

[Series 4: mesenteric axial arterial · axial · arterial · 0.69mm/px · z∈[+1110,+1222]mm · 3 of 238 slices shown]
[im 28/238  soft-tissue]
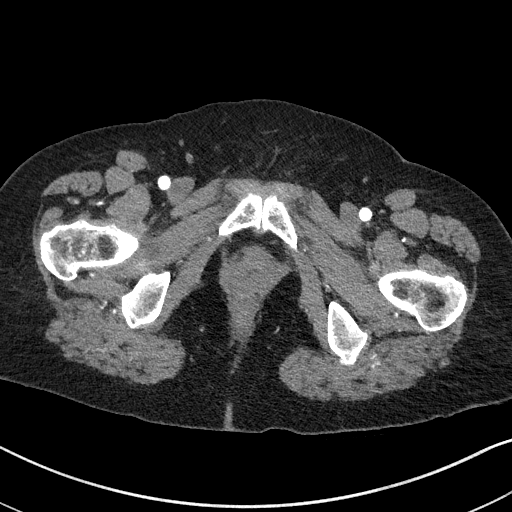
[im 56/238  soft-tissue]
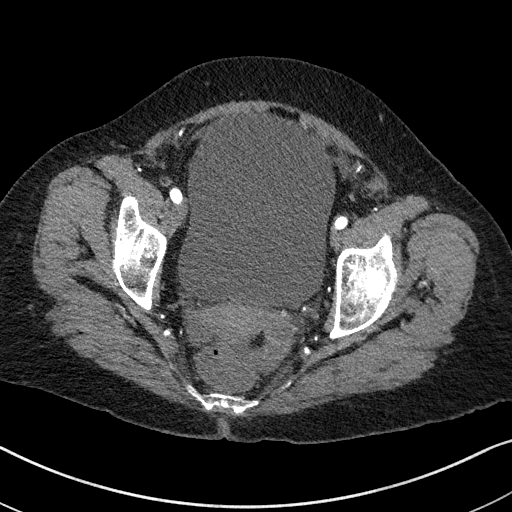
[im 84/238  soft-tissue]
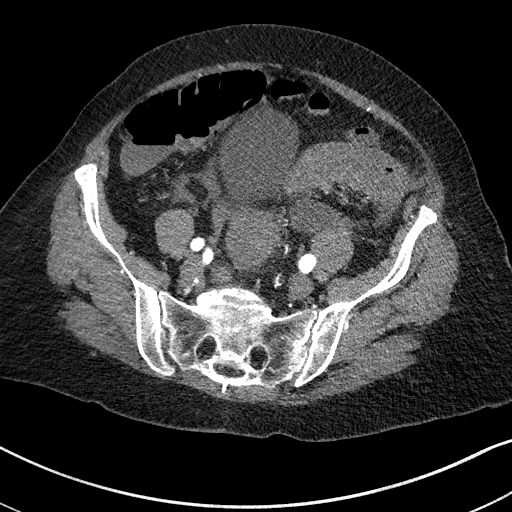

[Series 5: axial venous · axial · portal-venous · 0.68mm/px · z∈[+1134,+1454]mm · 5 of 98 slices shown, 10 images]
[im 17/98  soft-tissue]
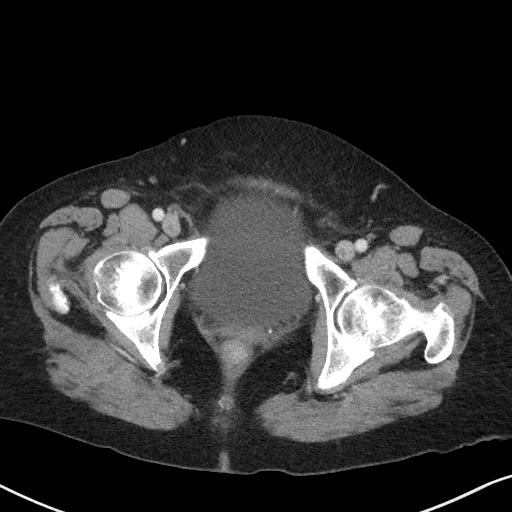
[im 17/98  bone]
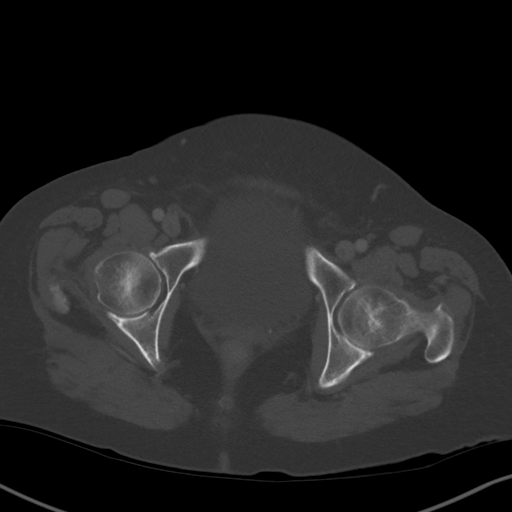
[im 33/98  soft-tissue]
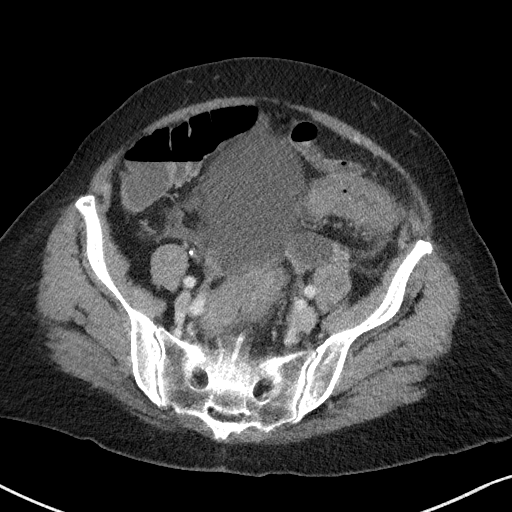
[im 33/98  lung]
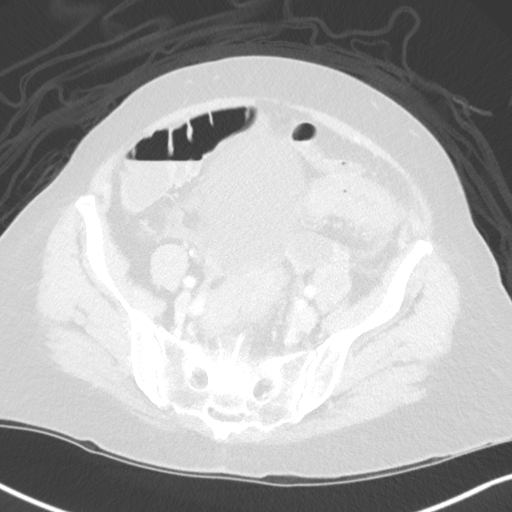
[im 49/98  soft-tissue]
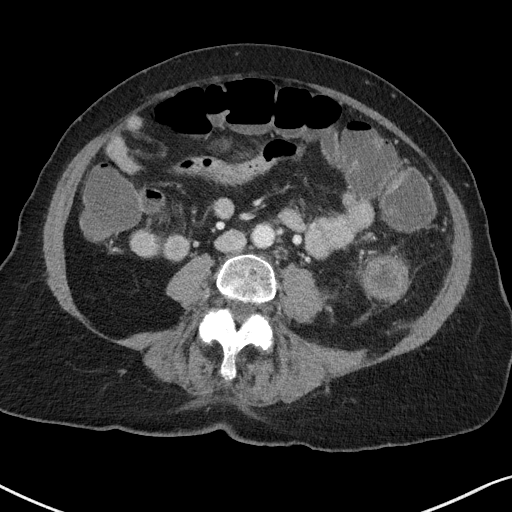
[im 49/98  lung]
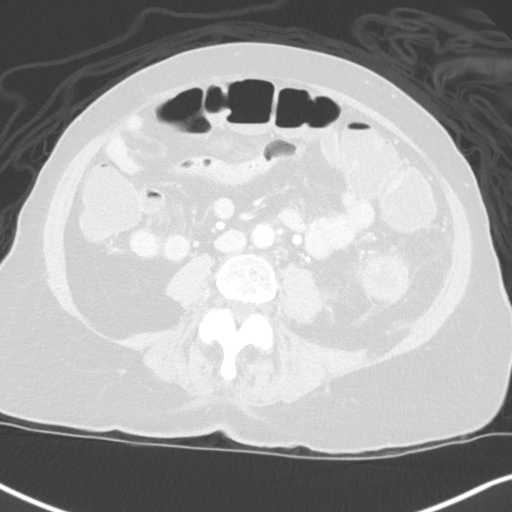
[im 65/98  soft-tissue]
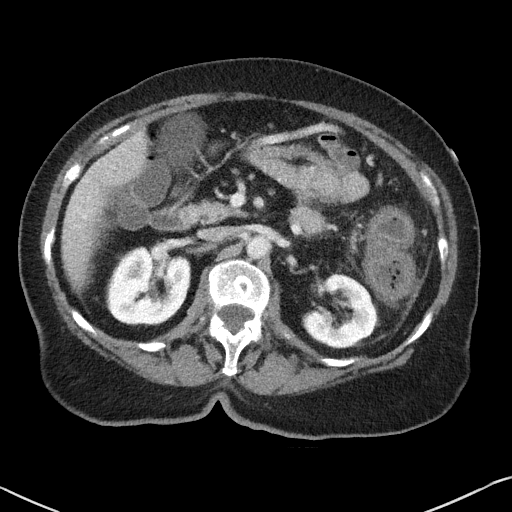
[im 65/98  lung]
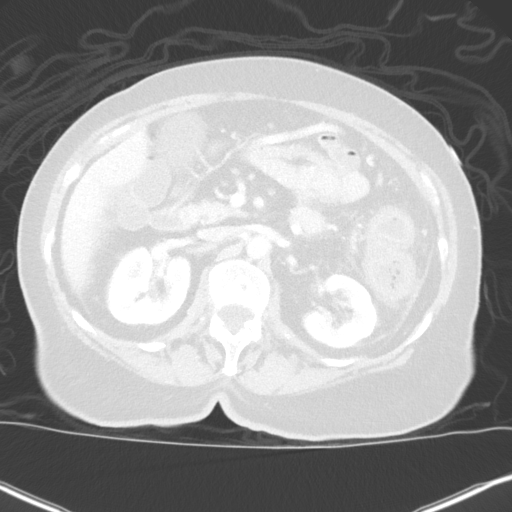
[im 81/98  soft-tissue]
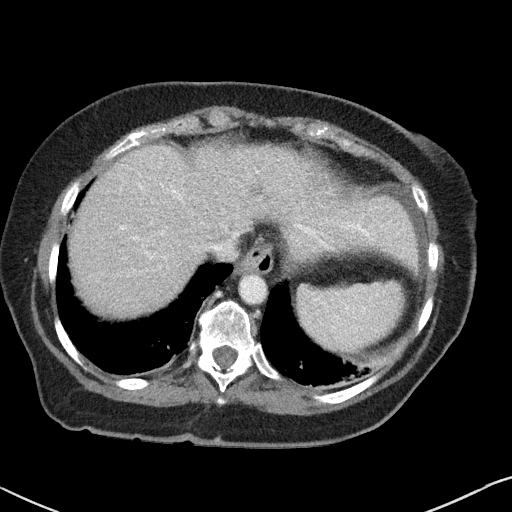
[im 81/98  lung]
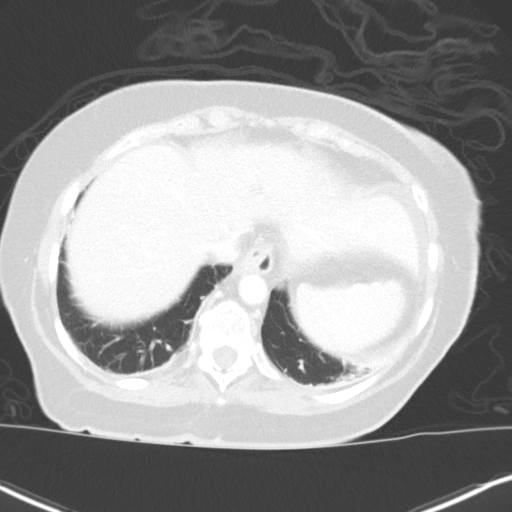

[Series 9: coronals · coronal · 0.84mm/px · 2 of 97 slices shown, 3 images]
[im 33/97  soft-tissue]
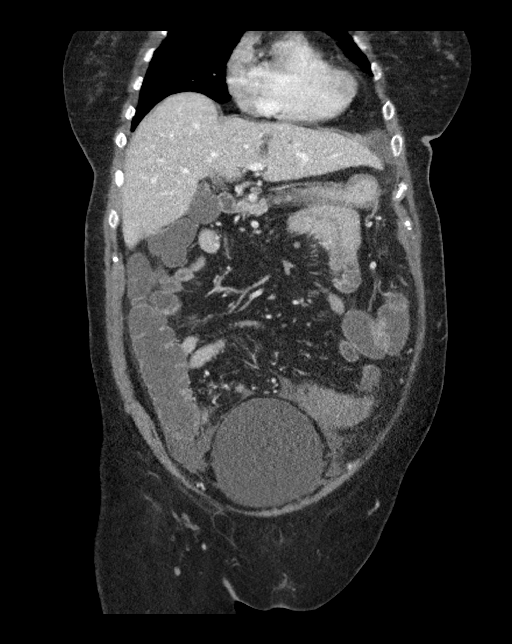
[im 33/97  bone]
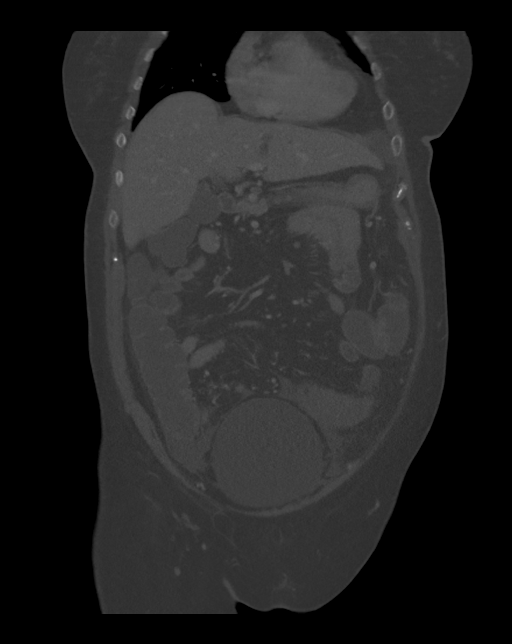
[im 65/97  soft-tissue]
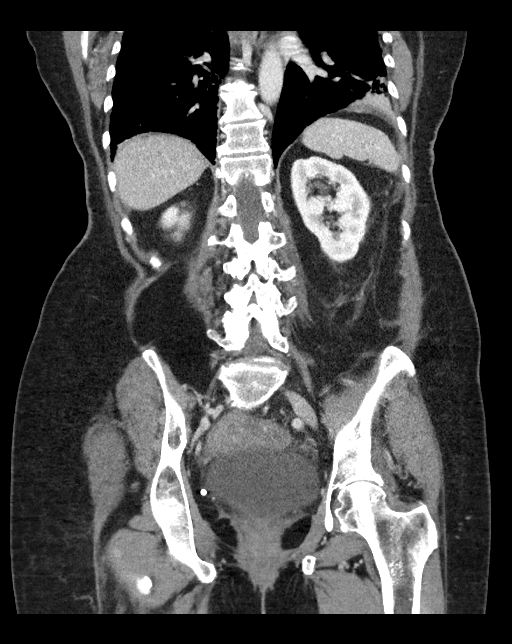

[10 of 46 positions shown; findings below may reference images not displayed]

FINDINGS: VASCULAR

Aorta: Mild atherosclerotic disease in the abdominal aorta without
aneurysm or dissection.

Celiac: Patent without evidence of aneurysm, dissection, vasculitis
or significant stenosis.

SMA: Patent without evidence of aneurysm, dissection, vasculitis or
significant stenosis.

Renals: Both renal arteries are patent without evidence of aneurysm,
dissection, vasculitis, fibromuscular dysplasia or significant
stenosis.

IMA: Patent without evidence of aneurysm, dissection, vasculitis or
significant stenosis.

Inflow: Patent without evidence of aneurysm, dissection, vasculitis
or significant stenosis.

Proximal Outflow: Proximal femoral arteries are patent bilaterally.

Veins: Portal venous system is patent. The mesenteric veins are
widely patent. Narrowing of the external iliac veins may be related
to the urinary bladder distension. IVC and renal veins are patent.

Review of the MIP images confirms the above findings.

NON-VASCULAR

Lower chest: Atelectasis or scarring in the lingula. No large
pleural effusions. Probable small hiatal hernia.

Hepatobiliary: Lobulated cystic structure in the left hepatic lobe
measuring 3.2 x 2.1 cm. Small amount of perihepatic ascites.
Gallbladder is mildly distended without inflammatory changes. Portal
venous system is patent. No suspicious liver lesions.

Pancreas: Low-density or cystic lesion in the distal pancreatic body
/ tail region. This is best seen on the coronal reformats, sequence
9, image 49. Lesion measures up to 1.7 cm. There is no significant
pancreatic duct dilatation.

Spleen: Normal in size without focal abnormality.

Adrenals/Urinary Tract: Normal adrenal glands. Normal appearance of
both kidneys without hydronephrosis. No suspicious renal lesions.
Urinary bladder is distended.

Stomach/Bowel: Wall thickening in sigmoid colon and descending colon
including the splenic flexure. Pericolonic edema involving the
descending colon. There appears to be mucosal enhancement involving
the segments of colon with wall thickening. There is normal mucosal
enhancement in the rectum containing a small amount of fluid. Normal
appearance of the right colon and transverse colon. Appendix is not
confidently identified but the cecum is slightly anteriorly located.
Small hiatal hernia. Normal appearance of the stomach. Normal
appearance of small bowel.

Lymphatic: No significant lymph node enlargement in the abdomen or
pelvis.

Reproductive: Low-density structure involving the left ovary could
represent a cystic structure measuring to 4.1 cm on sequence 9,
image 57. This is indeterminate for a patient of this age. No gross
abnormality to the uterus.

Other: Small amount of free fluid in the pelvis. Small amount of
free fluid in the lower abdominal quadrants.

Musculoskeletal: No acute bone abnormality.
IMPRESSION: VASCULAR

1. Minimal atherosclerotic disease in the abdomen and pelvis.
Negative for an arterial aneurysm, dissection or emboli.
2. Main visceral arteries are widely patent. Mesenteric veins are
patent.

NON-VASCULAR

1. Wall thickening involving the splenic flexure, descending colon
and sigmoid colon. Imaging findings are suggestive for colitis.
Etiology for the colitis is not clear based on the imaging findings.
2. Small amount of ascites in the abdomen and pelvis likely
secondary to the inflammatory changes in the left colon.
3. **An incidental finding of potential clinical significance has
been found. Indeterminate 1.7 cm low-density, probable cystic lesion
involving the distal pancreatic body region. Recommend six-month
follow up imaging with MRI.
4. **An incidental finding of potential clinical significance has
been found. Enlarged left ovary and possible cystic structure.
Recommend follow-up pelvic ultrasound to evaluate the left ovary. **

## 2020-01-17 DIAGNOSIS — H35363 Drusen (degenerative) of macula, bilateral: Secondary | ICD-10-CM | POA: Diagnosis not present

## 2020-09-27 DIAGNOSIS — Z0000 Encounter for general adult medical examination without abnormal findings: Secondary | ICD-10-CM | POA: Diagnosis not present

## 2020-09-27 DIAGNOSIS — E785 Hyperlipidemia, unspecified: Secondary | ICD-10-CM | POA: Diagnosis not present

## 2020-09-27 DIAGNOSIS — R5383 Other fatigue: Secondary | ICD-10-CM | POA: Diagnosis not present

## 2020-09-27 DIAGNOSIS — Z299 Encounter for prophylactic measures, unspecified: Secondary | ICD-10-CM | POA: Diagnosis not present

## 2020-09-27 DIAGNOSIS — Z1331 Encounter for screening for depression: Secondary | ICD-10-CM | POA: Diagnosis not present

## 2020-09-27 DIAGNOSIS — Z79899 Other long term (current) drug therapy: Secondary | ICD-10-CM | POA: Diagnosis not present

## 2020-09-27 DIAGNOSIS — E039 Hypothyroidism, unspecified: Secondary | ICD-10-CM | POA: Diagnosis not present

## 2020-09-27 DIAGNOSIS — Z6826 Body mass index (BMI) 26.0-26.9, adult: Secondary | ICD-10-CM | POA: Diagnosis not present

## 2020-09-27 DIAGNOSIS — I10 Essential (primary) hypertension: Secondary | ICD-10-CM | POA: Diagnosis not present

## 2020-09-27 DIAGNOSIS — Z7189 Other specified counseling: Secondary | ICD-10-CM | POA: Diagnosis not present

## 2020-09-27 DIAGNOSIS — Z1339 Encounter for screening examination for other mental health and behavioral disorders: Secondary | ICD-10-CM | POA: Diagnosis not present

## 2020-12-15 DIAGNOSIS — I10 Essential (primary) hypertension: Secondary | ICD-10-CM | POA: Diagnosis not present

## 2020-12-15 DIAGNOSIS — E039 Hypothyroidism, unspecified: Secondary | ICD-10-CM | POA: Diagnosis not present

## 2020-12-15 DIAGNOSIS — Z299 Encounter for prophylactic measures, unspecified: Secondary | ICD-10-CM | POA: Diagnosis not present

## 2021-01-18 DIAGNOSIS — H353132 Nonexudative age-related macular degeneration, bilateral, intermediate dry stage: Secondary | ICD-10-CM | POA: Diagnosis not present

## 2021-06-18 DIAGNOSIS — Z299 Encounter for prophylactic measures, unspecified: Secondary | ICD-10-CM | POA: Diagnosis not present

## 2021-06-18 DIAGNOSIS — I10 Essential (primary) hypertension: Secondary | ICD-10-CM | POA: Diagnosis not present

## 2021-09-19 DIAGNOSIS — J018 Other acute sinusitis: Secondary | ICD-10-CM | POA: Diagnosis not present

## 2021-09-19 DIAGNOSIS — R051 Acute cough: Secondary | ICD-10-CM | POA: Diagnosis not present

## 2021-09-19 DIAGNOSIS — J209 Acute bronchitis, unspecified: Secondary | ICD-10-CM | POA: Diagnosis not present

## 2021-10-26 DIAGNOSIS — Z299 Encounter for prophylactic measures, unspecified: Secondary | ICD-10-CM | POA: Diagnosis not present

## 2021-10-26 DIAGNOSIS — I10 Essential (primary) hypertension: Secondary | ICD-10-CM | POA: Diagnosis not present

## 2021-10-26 DIAGNOSIS — Z6825 Body mass index (BMI) 25.0-25.9, adult: Secondary | ICD-10-CM | POA: Diagnosis not present

## 2021-10-26 DIAGNOSIS — R5383 Other fatigue: Secondary | ICD-10-CM | POA: Diagnosis not present

## 2021-10-26 DIAGNOSIS — Z789 Other specified health status: Secondary | ICD-10-CM | POA: Diagnosis not present

## 2021-10-26 DIAGNOSIS — E039 Hypothyroidism, unspecified: Secondary | ICD-10-CM | POA: Diagnosis not present

## 2021-10-26 DIAGNOSIS — E7800 Pure hypercholesterolemia, unspecified: Secondary | ICD-10-CM | POA: Diagnosis not present

## 2021-10-26 DIAGNOSIS — Z0000 Encounter for general adult medical examination without abnormal findings: Secondary | ICD-10-CM | POA: Diagnosis not present

## 2021-10-26 DIAGNOSIS — Z1339 Encounter for screening examination for other mental health and behavioral disorders: Secondary | ICD-10-CM | POA: Diagnosis not present

## 2021-10-26 DIAGNOSIS — Z1331 Encounter for screening for depression: Secondary | ICD-10-CM | POA: Diagnosis not present

## 2021-10-26 DIAGNOSIS — Z7189 Other specified counseling: Secondary | ICD-10-CM | POA: Diagnosis not present

## 2021-10-30 DIAGNOSIS — Z79899 Other long term (current) drug therapy: Secondary | ICD-10-CM | POA: Diagnosis not present

## 2021-10-30 DIAGNOSIS — E7800 Pure hypercholesterolemia, unspecified: Secondary | ICD-10-CM | POA: Diagnosis not present

## 2021-10-30 DIAGNOSIS — E039 Hypothyroidism, unspecified: Secondary | ICD-10-CM | POA: Diagnosis not present

## 2021-10-30 DIAGNOSIS — R5383 Other fatigue: Secondary | ICD-10-CM | POA: Diagnosis not present

## 2022-02-26 DIAGNOSIS — D692 Other nonthrombocytopenic purpura: Secondary | ICD-10-CM | POA: Diagnosis not present

## 2022-02-26 DIAGNOSIS — Z789 Other specified health status: Secondary | ICD-10-CM | POA: Diagnosis not present

## 2022-02-26 DIAGNOSIS — Z299 Encounter for prophylactic measures, unspecified: Secondary | ICD-10-CM | POA: Diagnosis not present

## 2022-02-26 DIAGNOSIS — I10 Essential (primary) hypertension: Secondary | ICD-10-CM | POA: Diagnosis not present

## 2022-07-16 DIAGNOSIS — H353132 Nonexudative age-related macular degeneration, bilateral, intermediate dry stage: Secondary | ICD-10-CM | POA: Diagnosis not present

## 2022-10-17 DIAGNOSIS — Z7189 Other specified counseling: Secondary | ICD-10-CM | POA: Diagnosis not present

## 2022-10-17 DIAGNOSIS — R5383 Other fatigue: Secondary | ICD-10-CM | POA: Diagnosis not present

## 2022-10-17 DIAGNOSIS — E039 Hypothyroidism, unspecified: Secondary | ICD-10-CM | POA: Diagnosis not present

## 2022-10-17 DIAGNOSIS — Z0000 Encounter for general adult medical examination without abnormal findings: Secondary | ICD-10-CM | POA: Diagnosis not present

## 2022-10-17 DIAGNOSIS — E7800 Pure hypercholesterolemia, unspecified: Secondary | ICD-10-CM | POA: Diagnosis not present

## 2022-10-17 DIAGNOSIS — Z1339 Encounter for screening examination for other mental health and behavioral disorders: Secondary | ICD-10-CM | POA: Diagnosis not present

## 2022-10-17 DIAGNOSIS — Z299 Encounter for prophylactic measures, unspecified: Secondary | ICD-10-CM | POA: Diagnosis not present

## 2022-10-17 DIAGNOSIS — Z1331 Encounter for screening for depression: Secondary | ICD-10-CM | POA: Diagnosis not present

## 2022-10-17 DIAGNOSIS — I10 Essential (primary) hypertension: Secondary | ICD-10-CM | POA: Diagnosis not present

## 2022-10-17 DIAGNOSIS — Z789 Other specified health status: Secondary | ICD-10-CM | POA: Diagnosis not present

## 2022-10-22 DIAGNOSIS — R5383 Other fatigue: Secondary | ICD-10-CM | POA: Diagnosis not present

## 2022-10-22 DIAGNOSIS — H6123 Impacted cerumen, bilateral: Secondary | ICD-10-CM | POA: Diagnosis not present

## 2022-10-22 DIAGNOSIS — Z299 Encounter for prophylactic measures, unspecified: Secondary | ICD-10-CM | POA: Diagnosis not present

## 2022-10-22 DIAGNOSIS — I10 Essential (primary) hypertension: Secondary | ICD-10-CM | POA: Diagnosis not present

## 2022-10-22 DIAGNOSIS — Z79899 Other long term (current) drug therapy: Secondary | ICD-10-CM | POA: Diagnosis not present

## 2022-10-22 DIAGNOSIS — E7800 Pure hypercholesterolemia, unspecified: Secondary | ICD-10-CM | POA: Diagnosis not present

## 2022-10-22 DIAGNOSIS — E039 Hypothyroidism, unspecified: Secondary | ICD-10-CM | POA: Diagnosis not present

## 2022-10-29 DIAGNOSIS — Z299 Encounter for prophylactic measures, unspecified: Secondary | ICD-10-CM | POA: Diagnosis not present

## 2022-10-29 DIAGNOSIS — I10 Essential (primary) hypertension: Secondary | ICD-10-CM | POA: Diagnosis not present

## 2022-10-29 DIAGNOSIS — H6122 Impacted cerumen, left ear: Secondary | ICD-10-CM | POA: Diagnosis not present

## 2023-01-30 DIAGNOSIS — H43813 Vitreous degeneration, bilateral: Secondary | ICD-10-CM | POA: Diagnosis not present

## 2023-01-30 DIAGNOSIS — H524 Presbyopia: Secondary | ICD-10-CM | POA: Diagnosis not present

## 2023-04-30 DIAGNOSIS — H6121 Impacted cerumen, right ear: Secondary | ICD-10-CM | POA: Diagnosis not present

## 2023-04-30 DIAGNOSIS — I10 Essential (primary) hypertension: Secondary | ICD-10-CM | POA: Diagnosis not present

## 2023-04-30 DIAGNOSIS — Z299 Encounter for prophylactic measures, unspecified: Secondary | ICD-10-CM | POA: Diagnosis not present

## 2023-08-13 DIAGNOSIS — H353132 Nonexudative age-related macular degeneration, bilateral, intermediate dry stage: Secondary | ICD-10-CM | POA: Diagnosis not present

## 2023-10-01 DIAGNOSIS — Z299 Encounter for prophylactic measures, unspecified: Secondary | ICD-10-CM | POA: Diagnosis not present

## 2023-10-01 DIAGNOSIS — E039 Hypothyroidism, unspecified: Secondary | ICD-10-CM | POA: Diagnosis not present

## 2023-10-01 DIAGNOSIS — E7800 Pure hypercholesterolemia, unspecified: Secondary | ICD-10-CM | POA: Diagnosis not present

## 2023-10-01 DIAGNOSIS — Z0000 Encounter for general adult medical examination without abnormal findings: Secondary | ICD-10-CM | POA: Diagnosis not present

## 2023-10-01 DIAGNOSIS — R5383 Other fatigue: Secondary | ICD-10-CM | POA: Diagnosis not present

## 2023-10-01 DIAGNOSIS — Z79899 Other long term (current) drug therapy: Secondary | ICD-10-CM | POA: Diagnosis not present

## 2023-10-01 DIAGNOSIS — I10 Essential (primary) hypertension: Secondary | ICD-10-CM | POA: Diagnosis not present

## 2023-10-01 DIAGNOSIS — Z1339 Encounter for screening examination for other mental health and behavioral disorders: Secondary | ICD-10-CM | POA: Diagnosis not present

## 2023-10-01 DIAGNOSIS — Z7189 Other specified counseling: Secondary | ICD-10-CM | POA: Diagnosis not present

## 2023-10-01 DIAGNOSIS — Z1331 Encounter for screening for depression: Secondary | ICD-10-CM | POA: Diagnosis not present

## 2024-01-28 DIAGNOSIS — H43813 Vitreous degeneration, bilateral: Secondary | ICD-10-CM | POA: Diagnosis not present

## 2024-02-02 DIAGNOSIS — D692 Other nonthrombocytopenic purpura: Secondary | ICD-10-CM | POA: Diagnosis not present

## 2024-02-02 DIAGNOSIS — Z299 Encounter for prophylactic measures, unspecified: Secondary | ICD-10-CM | POA: Diagnosis not present

## 2024-02-02 DIAGNOSIS — I10 Essential (primary) hypertension: Secondary | ICD-10-CM | POA: Diagnosis not present

## 2024-08-02 DIAGNOSIS — H353112 Nonexudative age-related macular degeneration, right eye, intermediate dry stage: Secondary | ICD-10-CM | POA: Diagnosis not present

## 2024-09-30 DIAGNOSIS — Z7189 Other specified counseling: Secondary | ICD-10-CM | POA: Diagnosis not present

## 2024-09-30 DIAGNOSIS — Z1331 Encounter for screening for depression: Secondary | ICD-10-CM | POA: Diagnosis not present

## 2024-09-30 DIAGNOSIS — Z299 Encounter for prophylactic measures, unspecified: Secondary | ICD-10-CM | POA: Diagnosis not present

## 2024-09-30 DIAGNOSIS — R5383 Other fatigue: Secondary | ICD-10-CM | POA: Diagnosis not present

## 2024-09-30 DIAGNOSIS — I10 Essential (primary) hypertension: Secondary | ICD-10-CM | POA: Diagnosis not present

## 2024-09-30 DIAGNOSIS — Z0000 Encounter for general adult medical examination without abnormal findings: Secondary | ICD-10-CM | POA: Diagnosis not present

## 2024-09-30 DIAGNOSIS — E7800 Pure hypercholesterolemia, unspecified: Secondary | ICD-10-CM | POA: Diagnosis not present

## 2024-09-30 DIAGNOSIS — Z79899 Other long term (current) drug therapy: Secondary | ICD-10-CM | POA: Diagnosis not present

## 2024-09-30 DIAGNOSIS — Z1339 Encounter for screening examination for other mental health and behavioral disorders: Secondary | ICD-10-CM | POA: Diagnosis not present
